# Patient Record
Sex: Female | Born: 1980 | Race: White | Hispanic: No | Marital: Married | Smoking: Never smoker
Health system: Southern US, Community
[De-identification: ages and names within clinical notes are randomized; demographics above are authoritative.]

## PROBLEM LIST (undated history)

## (undated) DIAGNOSIS — N390 Urinary tract infection, site not specified: Secondary | ICD-10-CM

## (undated) DIAGNOSIS — O139 Gestational [pregnancy-induced] hypertension without significant proteinuria, unspecified trimester: Secondary | ICD-10-CM

## (undated) HISTORY — PX: DILATION AND CURETTAGE OF UTERUS: SHX78

---

## 2008-11-03 ENCOUNTER — Ambulatory Visit (HOSPITAL_COMMUNITY): Admission: RE | Admit: 2008-11-03 | Discharge: 2008-11-03 | Payer: Self-pay | Admitting: Obstetrics

## 2009-10-25 ENCOUNTER — Inpatient Hospital Stay (HOSPITAL_COMMUNITY): Admission: AD | Admit: 2009-10-25 | Discharge: 2009-10-26 | Payer: Self-pay | Admitting: Obstetrics

## 2009-10-25 DIAGNOSIS — R51 Headache: Secondary | ICD-10-CM

## 2009-10-25 DIAGNOSIS — O9989 Other specified diseases and conditions complicating pregnancy, childbirth and the puerperium: Secondary | ICD-10-CM

## 2010-01-11 ENCOUNTER — Inpatient Hospital Stay (HOSPITAL_COMMUNITY): Admission: AD | Admit: 2010-01-11 | Discharge: 2009-11-07 | Payer: Self-pay | Admitting: Obstetrics and Gynecology

## 2010-04-19 LAB — URINALYSIS, ROUTINE W REFLEX MICROSCOPIC
Bilirubin Urine: NEGATIVE
Glucose, UA: NEGATIVE mg/dL
Hgb urine dipstick: NEGATIVE
Ketones, ur: NEGATIVE mg/dL
Nitrite: NEGATIVE
Protein, ur: NEGATIVE mg/dL
Specific Gravity, Urine: 1.025 (ref 1.005–1.030)
Urobilinogen, UA: 0.2 mg/dL (ref 0.0–1.0)
pH: 5.5 (ref 5.0–8.0)

## 2010-04-19 LAB — CBC
Hemoglobin: 11.5 g/dL — ABNORMAL LOW (ref 12.0–15.0)
MCH: 34.1 pg — ABNORMAL HIGH (ref 26.0–34.0)
MCV: 98.5 fL (ref 78.0–100.0)
Platelets: 262 10*3/uL (ref 150–400)
RBC: 3.37 MIL/uL — ABNORMAL LOW (ref 3.87–5.11)
RDW: 13.3 % (ref 11.5–15.5)
WBC: 13.3 10*3/uL — ABNORMAL HIGH (ref 4.0–10.5)
WBC: 14 10*3/uL — ABNORMAL HIGH (ref 4.0–10.5)

## 2010-04-19 LAB — URINE MICROSCOPIC-ADD ON: RBC / HPF: NONE SEEN RBC/hpf (ref <3)

## 2010-04-19 LAB — COMPREHENSIVE METABOLIC PANEL
ALT: 10 U/L (ref 0–35)
Albumin: 2.8 g/dL — ABNORMAL LOW (ref 3.5–5.2)
Alkaline Phosphatase: 188 U/L — ABNORMAL HIGH (ref 39–117)
GFR calc Af Amer: >60 mL/min (ref >60)
Potassium: 4.1 mEq/L (ref 3.5–5.1)
Sodium: 136 mEq/L (ref 135–145)
Total Protein: 6 g/dL (ref 6.0–8.3)

## 2010-04-19 LAB — LACTATE DEHYDROGENASE: LDH: 121 U/L (ref 94–250)

## 2010-04-19 LAB — RPR: RPR Ser Ql: NONREACTIVE

## 2011-09-27 ENCOUNTER — Encounter (HOSPITAL_COMMUNITY): Payer: Self-pay | Admitting: *Deleted

## 2011-09-27 ENCOUNTER — Inpatient Hospital Stay (HOSPITAL_COMMUNITY)
Admission: AD | Admit: 2011-09-27 | Discharge: 2011-10-29 | DRG: 650 | Disposition: A | Discharge status: Hospice | Payer: BC Managed Care – PPO | Source: Ambulatory Visit | Attending: Obstetrics and Gynecology | Admitting: Obstetrics and Gynecology

## 2011-09-27 DIAGNOSIS — O4410 Placenta previa with hemorrhage, unspecified trimester: Principal | ICD-10-CM | POA: Diagnosis present

## 2011-09-27 DIAGNOSIS — D649 Anemia, unspecified: Secondary | ICD-10-CM | POA: Diagnosis present

## 2011-09-27 DIAGNOSIS — O4400 Placenta previa specified as without hemorrhage, unspecified trimester: Secondary | ICD-10-CM | POA: Diagnosis present

## 2011-09-27 DIAGNOSIS — O9902 Anemia complicating childbirth: Secondary | ICD-10-CM | POA: Diagnosis present

## 2011-09-27 HISTORY — DX: Gestational (pregnancy-induced) hypertension without significant proteinuria, unspecified trimester: O13.9

## 2011-09-27 HISTORY — DX: Urinary tract infection, site not specified: N39.0

## 2011-09-27 LAB — CBC
HCT: 32.8 % — ABNORMAL LOW (ref 36.0–46.0)
MCHC: 33.8 g/dL (ref 30.0–36.0)
Platelets: 216 10*3/uL (ref 150–400)
RDW: 13.1 % (ref 11.5–15.5)
WBC: 13 10*3/uL — ABNORMAL HIGH (ref 4.0–10.5)

## 2011-09-27 LAB — OB RESULTS CONSOLE ABO/RH: RH Type: POSITIVE

## 2011-09-27 LAB — OB RESULTS CONSOLE ANTIBODY SCREEN: Antibody Screen: NEGATIVE

## 2011-09-27 LAB — OB RESULTS CONSOLE RUBELLA ANTIBODY, IGM: Rubella: IMMUNE

## 2011-09-27 LAB — OB RESULTS CONSOLE HEPATITIS B SURFACE ANTIGEN: Hepatitis B Surface Ag: NEGATIVE

## 2011-09-27 MED ORDER — ACETAMINOPHEN 325 MG PO TABS
650.0000 mg | ORAL_TABLET | ORAL | Status: DC | PRN
  Administered 2011-10-03: 650 mg via ORAL
  Filled 2011-09-27: qty 2

## 2011-09-27 MED ORDER — NIFEDIPINE 10 MG PO CAPS
20.0000 mg | ORAL_CAPSULE | Freq: Once | ORAL | Status: AC
  Administered 2011-09-27: 20 mg via ORAL

## 2011-09-27 MED ORDER — INDOMETHACIN 25 MG PO CAPS
25.0000 mg | ORAL_CAPSULE | Freq: Four times a day (QID) | ORAL | Status: AC
  Administered 2011-09-28 – 2011-09-29 (×8): 25 mg via ORAL
  Filled 2011-09-27 (×10): qty 1

## 2011-09-27 MED ORDER — CALCIUM CARBONATE ANTACID 500 MG PO CHEW
2.0000 | CHEWABLE_TABLET | ORAL | Status: DC | PRN
  Administered 2011-10-04 – 2011-10-09 (×2): 400 mg via ORAL
  Administered 2011-10-18: 600 mg via ORAL
  Administered 2011-10-18: 400 mg via ORAL
  Filled 2011-09-27 (×4): qty 2

## 2011-09-27 MED ORDER — ZOLPIDEM TARTRATE 5 MG PO TABS: 5.0000 mg | ORAL_TABLET | Freq: Every evening | ORAL | Status: DC | PRN

## 2011-09-27 MED ORDER — MAGNESIUM SULFATE 40 G IN LACTATED RINGERS - SIMPLE
1.0000 g/h | INTRAVENOUS | Status: DC
  Filled 2011-09-27: qty 500

## 2011-09-27 MED ORDER — MAGNESIUM SULFATE BOLUS VIA INFUSION
4.0000 g | Freq: Once | INTRAVENOUS | Status: AC
  Administered 2011-09-27: 4 g via INTRAVENOUS
  Filled 2011-09-27: qty 500

## 2011-09-27 MED ORDER — LACTATED RINGERS IV SOLN
INTRAVENOUS | Status: DC
  Administered 2011-09-27: 18:00:00 via INTRAVENOUS
  Administered 2011-09-28: 75 mL/h via INTRAVENOUS

## 2011-09-27 MED ORDER — NIFEDIPINE 10 MG PO CAPS
ORAL_CAPSULE | ORAL | Status: AC
  Administered 2011-09-27: 20 mg via ORAL
  Filled 2011-09-27: qty 2

## 2011-09-27 MED ORDER — PRENATAL MULTIVITAMIN CH
1.0000 | ORAL_TABLET | Freq: Every day | ORAL | Status: DC
  Administered 2011-09-28 – 2011-10-24 (×27): 1 via ORAL
  Filled 2011-09-27 (×29): qty 1

## 2011-09-27 MED ORDER — DOCUSATE SODIUM 100 MG PO CAPS
100.0000 mg | ORAL_CAPSULE | Freq: Every day | ORAL | Status: DC
  Administered 2011-09-28 – 2011-10-08 (×11): 100 mg via ORAL
  Filled 2011-09-27 (×11): qty 1

## 2011-09-27 MED ORDER — INDOMETHACIN 50 MG PO CAPS
50.0000 mg | ORAL_CAPSULE | Freq: Once | ORAL | Status: AC
  Administered 2011-09-27: 50 mg via ORAL
  Filled 2011-09-27: qty 1

## 2011-09-27 NOTE — MAU Provider Note (Signed)
Attestation of Attending Supervision of Advanced Practitioner (CNM/NP): Evaluation and management procedures were performed by the Advanced Practitioner under my supervision and collaboration.  I have reviewed the Advanced Practitioner's note and chart, and I agree with the management and plan.  HARRAWAY-SMITH, Jvion Turgeon 9:23 PM     

## 2011-09-27 NOTE — MAU Note (Signed)
Known previa, hx of bleed in July- received betamethasone.  Noted bleeding today around 4.  Denies pain. abd soft

## 2011-09-27 NOTE — MAU Provider Note (Signed)
Pt presents at [redacted]w[redacted]d with known previa. Reports vaginal bleeding since 4pm today. Denies leaking of fluid, contractions, abd pain. States good fetal movement.   Filed Vitals:   09/27/11 1723  BP: 111/70  Pulse: 97  Temp: 98.6 F (37 C)  Resp: 18   General : WF, NAD, comfortable appearing Abd: gravid, non tender, soft Perineum: blood stained, small amount of BRB noted on new pad Ext: full ROM, no edema, no evidence DVT FHR: 125, mod variability, + 15x15 accels, no decels Cat I tracing Toco: No contractions  Assessment:  Bleeding previa at [redacted]w[redacted]d Fetal testing c/w well-being  Plan:  Will admit to ante with routine orders. Dr. Billy Coast to see pt after finishing in OR Pt s/p BMZ in July  Neylan Koroma E. 6:17 PM

## 2011-09-27 NOTE — Progress Notes (Signed)
Patient ID: Denise Hunt, female   DOB: Jan 29, 1981, 31 y.o.   MRN: [id number] S: Acute episode of heavy bleeding today, now with spotting. NO LOF. Good FM. Denies Contractions. No CP or SOB.  O: BP 93/58  Pulse 120  Temp 98.5 F (36.9 C) (Oral)  Resp 18  Ht 5\' 2"  (1.575 m)  Wt 60.238 kg (132 lb 12.8 oz)  BMI 24.29 kg/m2  Breastfeeding? Unknown  NCAT HEENT: nl Neck: supple with FROM Lgs: CTA CV: RRR Abd: Gravid Nt No CVAT Ext: neg c/c/e Pelvic : deferred Neuro: nonfocal Skin: intact  NST Reactive. Category 1 tracing Rare contractions  CBC    Component Value Date/Time   WBC 13.0* 09/27/2011 1807   RBC 3.42* 09/27/2011 1807   HGB 11.1* 09/27/2011 1807   HCT 32.8* 09/27/2011 1807   PLT 216 09/27/2011 1807   MCV 95.9 09/27/2011 1807   MCH 32.5 09/27/2011 1807   MCHC 33.8 09/27/2011 1807   RDW 13.1 09/27/2011 1807    A: 28 5/7 wk IUP Complete Previa with second episode of bleeding in last 30d. BMZ complete. Abnormal GTT  P: Proceed with MgSO4 neuroprophylaxis EFM and Toco Bedrest with BRP SCDs Type and screen.

## 2011-09-28 NOTE — H&P (Signed)
Denise Hunt, Denise Hunt            ACCOUNT NO.:  [id number]  MEDICAL RECORD NO.:  [id number]  LOCATION:  9159                          FACILITY:  [facility]  PHYSICIAN:  Lenoard Aden, M.D.DATE OF BIRTH:  02-20-1980  DATE OF ADMISSION:  09/27/2011 DATE OF DISCHARGE:                             HISTORY & PHYSICAL   CHIEF COMPLAINT:  Bleeding.  HISTORY OF PRESENT ILLNESS:  She is a 31 year old white female, G3, P1 at 59 and 5/7th weeks gestation who presents with acute onset of bleeding x1 this evening which is now tapered off to spotting.  She denies contractions.  She denies leakage of fluids.  She reports good fetal movement.  Her prenatal course has been complicated by complete placenta previa and abnormal 1-hour GTT.  ALLERGIES:  She has no known drug allergies.  MEDICATIONS:  Prenatal vitamins.  SOCIAL HISTORY:  She is a nonsmoker, nondrinker.  She denies domestic or physical violence.  She has medications to include Tums as well.  FAMILY HISTORY:  She has a family history of diabetes, hypertension, colon cancer, thyroid dysfunction.  Previous history of a primary vaginal delivery in 2011 and an SAB at 2009.  PHYSICAL EXAMINATION:  GENERAL:  She is a well-developed, well- nourished, white female, in no acute distress. VITAL SIGNS:  Stable.  The patient is afebrile. HEENT:  Normal. NECK:  Supple.  Full range of motion. LUNGS:  Clear. HEART:  Regular rhythm. ABDOMEN:  Soft, nontender. PELVIC:  Deferred. EXTREMITIES:  There are no cords. NEUROLOGIC:  Nonfocal. SKIN:  Intact.  Pad is consistent with just a small amount of spotting. NST is reactive.  No contractions were noted.  IMPRESSION: 1. A 28 and 5/7th week intrauterine pregnancy. 2. Complete placenta previa with second episode of bleeding within the     last 30 days.  PLAN:  Proceed with admission and magnesium for neuro prophylaxis, type and cross.  Continuous fetal monitoring.  Bedrest with  bathroom privileges.     Lenoard Aden, M.D.     RJT/MEDQ  D:  09/27/2011  T:  09/28/2011  Job:  [id number]

## 2011-09-28 NOTE — Progress Notes (Signed)
Patient ID: Denise Hunt, female   DOB: 1980/02/12, 31 y.o.   MRN: [id number] HD #1 28 6/7 weeks Complete Previa  S: Acute episode of heavy bleeding today, now with spotting that has resolved over night.  NO LOF. Good FM.  Denies Contractions.  No CP or SOB.   O:  BP 102/61  Pulse 83  Temp 98 F (36.7 C) (Oral)  Resp 18  Ht 5\' 2"  (1.575 m)  Wt 60.238 kg (132 lb 12.8 oz)  BMI 24.29 kg/m2  Breastfeeding? Unknown  NCAT  HEENT: nl  Neck: supple with FROM  Lgs: CTA  CV: RRR  Abd: Gravid Nt  No CVAT  Ext: neg c/c/e , SCDs intact Pelvic : deferred  No blood on pad Neuro: nonfocal  Skin: intact   NST Reactive.  Category 1 tracing  Rare contractions   CBC    Component  Value  Date/Time    WBC  13.0*  09/27/2011 1807    RBC  3.42*  09/27/2011 1807    HGB  11.1*  09/27/2011 1807    HCT  32.8*  09/27/2011 1807    PLT  216  09/27/2011 1807    MCV  95.9  09/27/2011 1807    MCH  32.5  09/27/2011 1807    MCHC  33.8  09/27/2011 1807    RDW  13.1  09/27/2011 1807    A:  28 5/7 wk IUP  Complete Previa with second episode of bleeding in last 30d. BMZ complete.  Sono with AGA , nl AFI and nl CL on 8/22 Abnormal GTT   P:  Continue with MgSO4 neuroprophylaxis  Continue Indocin x 48 hrs EFM and Toco continuous today Bedrest with BRP  SCDs  Type and screen current  Will need 3hr GTT

## 2011-09-29 DIAGNOSIS — O4400 Placenta previa specified as without hemorrhage, unspecified trimester: Secondary | ICD-10-CM | POA: Diagnosis present

## 2011-09-29 NOTE — Progress Notes (Signed)
Patient ID: Denise Hunt, female   DOB: [birth year], 31 y.o.   MRN: [id number] HD#[id number]  29 0/7 weeks Complete Previa   S:  Acute episode of heavy bleeding on admission now with spotting especially when using restroom. NO LOF. Good FM.  Denies Contractions.  No CP or SOB.   O:  BP 102/69  Pulse 89  Temp 98 F (36.7 C) (Oral)  Resp 18  Ht 5\' 2"  (1.575 m)  Wt 60.238 kg (132 lb 12.8 oz)  BMI 24.29 kg/m2  Breastfeeding? Unknown  NCAT  HEENT: nl  Neck: supple with FROM  Lgs: CTA  CV: RRR  Abd: Gravid Nt  No CVAT  Ext: neg c/c/e , SCDs intact  Pelvic : deferred  No blood on pad  Neuro: nonfocal  Skin: intact   NST Reactive.  Category 1 tracing  Rare contractions   CBC    Component  Value  Date/Time    WBC  13.0*  09/27/2011 1807    RBC  3.42*  09/27/2011 1807    HGB  11.1*  09/27/2011 1807    HCT  32.8*  09/27/2011 1807    PLT  216  09/27/2011 1807    MCV  95.9  09/27/2011 1807    MCH  32.5  09/27/2011 1807    MCHC  33.8  09/27/2011 1807    RDW  13.1  09/27/2011 1807   A:  29 0/7 wk IUP  Complete Previa with second episode of bleeding in last 30d. BMZ complete. Continued spotting. Sono with AGA , nl AFI and nl CL on 8/22  Abnormal GTT   P:   MgSO4 neuroprophylaxis completed Continue Indocin x 48 hrs  EFM and Toco for one hr q shift Bedrest with BRP  SCDs  Type and screen current  Will need NICU consult (pt considering, declines at this time) 3 GTT to be ordered Not a candidate for outpatient care at this time.

## 2011-09-30 MED ORDER — SODIUM CHLORIDE 0.9 % IJ SOLN
3.0000 mL | Freq: Two times a day (BID) | INTRAMUSCULAR | Status: DC
  Administered 2011-09-30 – 2011-10-05 (×9): 3 mL via INTRAVENOUS

## 2011-09-30 NOTE — Progress Notes (Signed)
Ur chart review completed.  

## 2011-09-30 NOTE — Progress Notes (Signed)
Patient ID: Denise Hunt, female   DOB: May 28, 1980, 31 y.o.   MRN: [id number] HD#[id number] 29 1/7 weeks Complete Previa with bleeding  S:  Acute episode of heavy bleeding on admission now with no bleeding since Saturday am 8/24. NO LOF. Good FM.  Denies Contractions or abdominal pain No CP or SOB.   O:  BP 104/65  Pulse 92  Temp 98.7 F (37.1 C) (Oral)  Resp 18  Ht 5\' 2"  (1.575 m)  Wt 60.238 kg (132 lb 12.8 oz)  BMI 24.29 kg/m2  Breastfeeding? Unknown  General: no distress  Abd: Gravid Nt , no fundal tenderness  Ext: neg c/c/e , SCDs intact  Pelvic : deferred  No blood on pad    NST Reactive.  Category 1 tracing  Rare contractions   CBC    Component  Value  Date/Time    WBC  13.0*  09/27/2011 1807    RBC  3.42*  09/27/2011 1807    HGB  11.1*  09/27/2011 1807    HCT  32.8*  09/27/2011 1807    PLT  216  09/27/2011 1807    MCV  95.9  09/27/2011 1807    MCH  32.5  09/27/2011 1807    MCHC  33.8  09/27/2011 1807    RDW  13.1  09/27/2011 1807   A:  29 1/7 wk IUP  Complete Previa with second episode of bleeding in last 30d. BMZ complete. Continued spotting. Sono with AGA , nl AFI and nl CL on 8/22  Abnormal GTT   P:   MgSO4 neuroprophylaxis completed S/p  Indocin x 48 hrs  EFM and Toco for one hr q shift Bedrest with BRP, will allow daily wheelchair privledges SCDs  Type and screen current, IV access Will need NICU consult (pt considering, declines at this time) 3 GTT tomorrow Not a candidate for outpatient care at this time, esp given long drive D/w pt rescue dose BMZ, defer at this time, consider around 31 wks unless clinical situation changes.  Chelly Dombeck A. 09/30/2011 11:18 AM

## 2011-10-01 LAB — TYPE AND SCREEN
ABO/RH(D): A POS
Antibody Screen: NEGATIVE
Unit division: 0
Unit division: 0

## 2011-10-01 LAB — GLUCOSE, 1 HOUR GESTATIONAL: Glucose Tolerance, 1 hour: 173 mg/dL (ref 70–189)

## 2011-10-01 LAB — GLUCOSE, 2 HOUR GESTATIONAL: Glucose Tolerance, 2 hour: 139 mg/dL (ref 70–164)

## 2011-10-01 LAB — GLUCOSE, 3 HOUR GESTATIONAL: Glucose, GTT - 3 Hour: 159 mg/dL — ABNORMAL HIGH (ref 70–144)

## 2011-10-01 MED ORDER — GUAIFENESIN 100 MG/5ML PO SYRP
200.0000 mg | ORAL_SOLUTION | ORAL | Status: DC | PRN
  Administered 2011-10-01 – 2011-10-04 (×5): 200 mg via ORAL
  Filled 2011-10-01 (×6): qty 118

## 2011-10-01 MED ORDER — SALINE SPRAY 0.65 % NA SOLN
1.0000 | NASAL | Status: DC | PRN
  Administered 2011-10-01: 1 via NASAL
  Filled 2011-10-01: qty 44

## 2011-10-01 NOTE — Progress Notes (Signed)
Patient ID: Denise Hunt, female   DOB: Dec 19, 1980, 31 y.o.   MRN: [id number] HD#[id number] 29 2/7 weeks Complete Previa with bleeding, last bleeding Sat 8/24  S:  Acute episode of heavy bleeding on admission now with no bleeding since Saturday am 8/24. NO LOF. Good FM.  Denies Contractions or abdominal pain, despite ctx noted on monitor earlier today. Pt notes stuffy nose. No CP or SOB.   O:  BP 106/65  Pulse 105  Temp 98.2 F (36.8 C) (Oral)  Resp 20  Ht 5\' 2"  (1.575 m)  Wt 60.238 kg (132 lb 12.8 oz)  BMI 24.29 kg/m2  Breastfeeding? Unknown  General: no distress  Abd: Gravid Nt , no fundal tenderness  Back: no cvat Ext: neg c/c/e , SCDs not on, will replace Pelvic : deferred  No blood on pad    NST Reactive.  Category 1 tracing: 130's, + accels, no decels, 10 beat variability Toco: ctx q 10-120 min, not felt by pt, resolved w/ hydration and void  CBC    Component  Value  Date/Time    WBC  13.0*  09/27/2011 1807    RBC  3.42*  09/27/2011 1807    HGB  11.1*  09/27/2011 1807    HCT  32.8*  09/27/2011 1807    PLT  216  09/27/2011 1807    MCV  95.9  09/27/2011 1807    MCH  32.5  09/27/2011 1807    MCHC  33.8  09/27/2011 1807    RDW  13.1  09/27/2011 1807   A:  29 2/7 wk IUP  Complete Previa with second episode of bleeding in last 30d. BMZ complete. No longer spotting. Sono with AGA , nl AFI and nl CL on 8/22, repeat in 2 wks Failed 1 hr GTT, 3 hr borderline but not diagnostic for GDM   P:   MgSO4 neuroprophylaxis completed S/p  Indocin x 48 hrs  EFM and Toco for one hr q shift Bedrest with BRP, will allow daily wheelchair privledges SCDs  Type and screen current, IV access Will need NICU consult (pt considering, declines at this time)  Not a candidate for outpatient care at this time, esp given long drive D/w pt rescue dose BMZ, defer at this time, consider around 31 wks unless clinical situation changes. Ctx on monitor today, will re-eval if this continues or these are  felt by pt. Would r/o UTI, consider tocolysis, rescue BMZ  Advik Weatherspoon A. 10/01/2011 12:19 PM

## 2011-10-01 NOTE — Progress Notes (Signed)
MD in OR - asked to evaluate patient / nurse reported ctx this am  S: Feeling well - some sinus congestion and cough from drainage / hx sinusitis every fall      Not feeling ctx - mostly FM - occasional braxton-hicks daily      No bleeding or discharge this am      Active FM   O:  VS: Blood pressure 106/65, pulse 105, temperature 98.2 F (36.8 C), temperature source Oral, resp. rate 20, height 5\' 2"  (1.575 m), weight 60.238 kg (132 lb 12.8 oz), unknown if currently breastfeeding.        FHR : baseline 140 / variability moderate / accels -none / decels -none        Toco: irregular contractions  5-10 minutes / lasting 20-40 sec / mild        Cervix : defer exam - previa      A: 29 weeks complete previa      Braxton-hicks ctx       FHR category 1      Mild URI - likely viral versus rhinitis  P: Increase PO hydration with sinus congestion      Monitor for regular ctx or symptomatic ctx      Saline spray for nasal congestion / robitussin for cough - congestion  Marlinda Mike CNM, MSN 10/01/2011, 9:13 AM

## 2011-10-01 NOTE — Progress Notes (Signed)
Maggie in the OR taking the phone call to tell MD that pt contracting 5-7 min and not really feeling them.

## 2011-10-02 LAB — TYPE AND SCREEN: Antibody Screen: NEGATIVE

## 2011-10-02 NOTE — Progress Notes (Signed)
Patient ID: Denise Hunt, female   DOB: 12/24/1980, 31 y.o.   MRN: [id number] HD#[id number] 29 3/7 weeks Complete Previa with bleeding, last bleeding Sat 8/24  S:  Acute episode of heavy bleeding on admission now with no bleeding since Saturday am 8/24. NO LOF. Good FM. Pt notes some brown spotting last pm in discharge. No red bleeding, no pain. Pt did have ctx noted on monitor  yest am, resolved w/ hydration and void. These ctx were not felt by pt. No further ctx.  Denies Contractions or abdominal pain.  Pt notes stuffy nose and cough, slightly worse than yesterday. Using saline nose spray and robitussin prn.  No CP or SOB.   O:  BP 110/72  Pulse 96  Temp 98.4 F (36.9 C) (Oral)  Resp 20  Ht 5\' 2"  (1.575 m)  Wt 58.695 kg (129 lb 6.4 oz)  BMI 23.67 kg/m2  Breastfeeding? Unknown  General: no distress  Abd: Gravid Nt , no fundal tenderness  Back: no cvat Ext: neg c/c/e , SCDs in place Pelvic : deferred  NST Reactive.  Category 1 tracing: 130's, + accels, no decels, 10 beat variability Toco: no ctx, no irritibility  CBC    Component  Value  Date/Time    WBC  13.0*  09/27/2011 1807    RBC  3.42*  09/27/2011 1807    HGB  11.1*  09/27/2011 1807    HCT  32.8*  09/27/2011 1807    PLT  216  09/27/2011 1807    MCV  95.9  09/27/2011 1807    MCH  32.5  09/27/2011 1807    MCHC  33.8  09/27/2011 1807    RDW  13.1  09/27/2011 1807   A:  29 3/7 wk IUP  Complete Previa with second episode of bleeding in last 30d. BMZ complete. No longer bleeding. Sono with AGA , nl AFI and nl CL on 8/22, repeat in 2 wks  P:   MgSO4 neuroprophylaxis completed S/p  Indocin x 48 hrs  EFM and Toco for one hr q shift Bedrest with BRP, will allow daily wheelchair privledges SCDs  Type and screen current, IV access Will need NICU consult (pt considering, declines at this time)  Not a candidate for outpatient care at this time, esp given long drive D/w pt rescue dose BMZ, defer at this time, consider around 31 wks  unless clinical situation changes. FWB. Reasurring status, will repeat growth 2 wks from last, due 9/5.   Holmes Hays A. 10/02/2011 9:10 PM  Note: late entry of note, pt seen 1 pm.

## 2011-10-03 NOTE — Progress Notes (Signed)
UR Chart review completed.  

## 2011-10-03 NOTE — Progress Notes (Signed)
Patient ID: Denise Hunt, female   DOB: 08-Apr-1980, 31 y.o.   MRN: [id number] HD#[id number] 29 4/7 weeks Complete Previa with bleeding, last bleeding Sat 8/24  S:  Acute episode of heavy bleeding on admission now with no bleeding since Saturday am 8/24. NO LOF. Good FM. No red bleeding, no pain. No ctx.  Denies Contractions or abdominal pain.  Pt notes stuffy nose and cough, pt states goes away when she sits outside. Using saline nose spray and robitussin prn.  No CP or SOB.   O:  BP 103/69  Pulse 123  Temp 98.5 F (36.9 C) (Oral)  Resp 20  Ht 5\' 2"  (1.575 m)  Wt 58.695 kg (129 lb 6.4 oz)  BMI 23.67 kg/m2  Breastfeeding? Unknown  General: no distress  Abd: Gravid Nt , no fundal tenderness  Back: no cvat Ext: neg c/c/e , SCDs in place Pelvic : deferred  NST Reactive.  Category 1 tracing: 150's, + accels, no decels, 10 beat variability (9p tracing, elevated FH from prior) Toco: rare ctx  CBC    Component  Value  Date/Time    WBC  13.0*  09/27/2011 1807    RBC  3.42*  09/27/2011 1807    HGB  11.1*  09/27/2011 1807    HCT  32.8*  09/27/2011 1807    PLT  216  09/27/2011 1807    MCV  95.9  09/27/2011 1807    MCH  32.5  09/27/2011 1807    MCHC  33.8  09/27/2011 1807    RDW  13.1  09/27/2011 1807   A:  29 4/7 wk IUP  Complete Previa with second episode of bleeding in last 30d. BMZ complete. No longer bleeding. Sono with AGA , nl AFI and nl CL on 8/22, repeat in 2 wks  P:   MgSO4 neuroprophylaxis completed S/p  Indocin x 48 hrs  EFM and Toco for one hr q shift Bedrest with BRP, will allow daily wheelchair privledges SCDs  Type and screen current, IV access has not continued due to pt's discomfort with IV- will need to resume IV with any bleeding, cramping, change in status Will need NICU consult (pt considering, declines at this time)  Not a candidate for outpatient care at this time, esp given long drive D/w pt rescue dose BMZ, defer at this time, consider around 31 wks unless  clinical situation changes. FWB. Reasurring status, will repeat growth 2 wks from last, due 9/5.   Consider humidifier in room  [facility] A. 10/03/2011 9:40 PM  Note: late entry of note, pt seen 530 pm.

## 2011-10-04 LAB — CBC
HCT: 37.4 % (ref 36.0–46.0)
Hemoglobin: 12.7 g/dL (ref 12.0–15.0)
MCH: 32.9 pg (ref 26.0–34.0)
MCHC: 34 g/dL (ref 30.0–36.0)
RDW: 13.5 % (ref 11.5–15.5)

## 2011-10-04 LAB — TYPE AND SCREEN
ABO/RH(D): A POS
Antibody Screen: NEGATIVE

## 2011-10-04 MED ORDER — SODIUM CHLORIDE 0.9 % IJ SOLN
3.0000 mL | Freq: Two times a day (BID) | INTRAMUSCULAR | Status: DC
  Administered 2011-10-05 – 2011-10-25 (×35): 3 mL via INTRAVENOUS

## 2011-10-04 MED ORDER — CITRIC ACID-SODIUM CITRATE 334-500 MG/5ML PO SOLN
ORAL | Status: AC
  Administered 2011-10-04
  Filled 2011-10-04: qty 15

## 2011-10-04 MED ORDER — GUAIFENESIN 100 MG/5ML PO SYRP
200.0000 mg | ORAL_SOLUTION | Freq: Four times a day (QID) | ORAL | Status: DC
  Administered 2011-10-04 – 2011-10-06 (×4): 200 mg via ORAL
  Filled 2011-10-04 (×13): qty 118

## 2011-10-04 MED ORDER — FLUTICASONE PROPIONATE 50 MCG/ACT NA SUSP
2.0000 | Freq: Every day | NASAL | Status: DC
  Administered 2011-10-04 – 2011-10-14 (×10): 2 via NASAL
  Filled 2011-10-04: qty 16

## 2011-10-04 MED ORDER — SODIUM CHLORIDE 0.9 % IJ SOLN: 3.0000 mL | INTRAMUSCULAR | Status: DC | PRN

## 2011-10-04 MED ORDER — CITRIC ACID-SODIUM CITRATE 334-500 MG/5ML PO SOLN
30.0000 mL | Freq: Once | ORAL | Status: AC
  Administered 2011-10-04: 30 mL via ORAL

## 2011-10-04 MED ORDER — LACTATED RINGERS IV BOLUS (SEPSIS)
500.0000 mL | Freq: Once | INTRAVENOUS | Status: AC
  Administered 2011-10-04: 500 mL via INTRAVENOUS

## 2011-10-04 NOTE — Progress Notes (Signed)
rn called to the room, quarter sized bright red blood noted in underwear when up to shower, monitors applied, will call md

## 2011-10-04 NOTE — Progress Notes (Signed)
Patient ID: NEELA ZECCA, female   DOB: 07/18/1980, 31 y.o.   MRN: [id number] HD#[id number] 29 5/7 weeks Complete Previa with bleeding, last bleeding Sat 8/24  S:  Acute episode of heavy bleeding on admission now with no bleeding since Saturday am 8/24. NO LOF. Good FM. No red bleeding, no pain. No ctx.  Denies Contractions or abdominal pain.  Pt notes stuffy nose and cough, cough starting to get productive, pt states goes away when she sits outside. Using saline nose spray and robitussin prn. Now starting to have nasal congestion as well, no fevers No CP or SOB.   O:  BP 117/76  Pulse 104  Temp 98.9 F (37.2 C) (Oral)  Resp 18  Ht 5\' 2"  (1.575 m)  Wt 58.695 kg (129 lb 6.4 oz)  BMI 23.67 kg/m2  Breastfeeding? Unknown  General: no distress, "sniffing"  Abd: Gravid Nt , no fundal tenderness  Back: no cvat Ext: neg c/c/e , SCDs in place Pelvic : deferred  NST pending this am  CBC    Component  Value  Date/Time    WBC  13.0*  09/27/2011 1807    RBC  3.42*  09/27/2011 1807    HGB  11.1*  09/27/2011 1807    HCT  32.8*  09/27/2011 1807    PLT  216  09/27/2011 1807    MCV  95.9  09/27/2011 1807    MCH  32.5  09/27/2011 1807    MCHC  33.8  09/27/2011 1807    RDW  13.1  09/27/2011 1807   A:  29 5/7 wk IUP  Complete Previa with second episode of bleeding in last 30d. BMZ complete. No longer bleeding. Sono with AGA , nl AFI and nl CL on 8/22, repeat in 2 wks  P:   MgSO4 neuroprophylaxis completed S/p  Indocin x 48 hrs  EFM and Toco for one hr q shift Bedrest with BRP, will allow daily wheelchair privledges SCDs  Type and screen current, IV access has not continued due to pt's discomfort with IV- will need to resume IV with any bleeding, cramping, change in status Will need NICU consult (pt considering, declines at this time)  Not a candidate for outpatient care at this time, esp given long drive D/w pt rescue dose BMZ, defer at this time, consider around 31 wks unless clinical situation  changes. FWB. Reasurring status, will repeat growth 2 wks from last, due 9/5.   Plan humidifier in room, increase robitussin, trial of Flonase  Shamona Wirtz A. 10/04/2011 8:15 AM

## 2011-10-04 NOTE — Plan of Care (Signed)
Problem: Consults Goal: Birthing Suites Patient Information Press F2 to bring up selections list   Pt < [redacted] weeks EGA     

## 2011-10-05 NOTE — Progress Notes (Addendum)
Patient ID: NISHIKA PARKHURST, female   DOB: September 14, 1980, 31 y.o.   MRN: [id number] HD#[id number] 29 6/7 weeks Complete Previa with bleeding, last bleeding on 8/30 pm, very light spot of red blood.   S:  Feels well, no further bleeding since small episode last night. No LOF. Good FM. No pain. No ctx.  Stuffy nose/ cough, cough etc improved, is awaiting setting up humidifier in her room. No fever/chills.  No CP or SOB.   O:  VS stable, reviewed, A&O x 3, pleasant.  Abd: Gravid, relaxed, non tender Ext: neg c/c/e , SCDs in place Pelvic : deferred NST - q shift- reactive. No contractions.    A:  29 6/7 wk IUP -inpatient care due to bleeding previa, but stable at present.  Complete Previa with second episode of bleeding in last 30d. Last night was just light spotting. BMZ complete.  Sono with AGA , nl AFI and nl CL on 8/22, repeat in 2 wks  P:  MgSO4 neuroprophylaxis completed S/p  Indocin x 48 hrs  EFM and Toco for one hr q shift Bedrest with BRP, will allow daily wheelchair privledges SCDs  Type and screen current Will need NICU consult (pt considering, declines at this time)  Not a candidate for outpatient care at this time, esp given long drive D/w pt rescue dose BMZ, defer at this time, consider around 31 wks unless clinical situation changes. FWB. Reasurring status, will repeat growth 2 wks from last, due 9/5.    Sundae Maners R 10/05/2011 7:00 PM

## 2011-10-06 NOTE — Progress Notes (Signed)
Patient ID: Denise Hunt, female   DOB: Oct 16, 1980, 31 y.o.   MRN: [id number] HD# [id number] 30.0 weeks Complete Previa with bleeding, last bleeding on 9/1 morning (today), one spot, dark blood.   S:  Feels well, no further active bleeding since small spot today, seems like old blood. No LOF. Good FM. No pain. No ctx.  Stuffy nose/ cough, cough etc improved, is awaiting setting up humidifier in her room. No fever/chills.  No CP or SOB. Cough more productive now. Mostly white but few times yellow. Some eye swelling (she gets that with sinusitis), denies HA/ earache/ eye pressure. Has some post-nasal drip  O:  VS stable, reviewed, A&O x 3, pleasant.  Lungs CTA bilateral. No cervical LAD or tenderness.  CV RRR Abd: Gravid, relaxed, non tender Ext: neg c/c/e , SCDs in place Pelvic : deferred NST - q shift- reactive. No contractions.    A:  30.0 wk IUP -inpatient care due to bleeding previa, but stable at present.  Complete Previa with second episode of bleeding in last 30d. One spot of old blood today, overall stable BMZ complete.  Sono with AGA , nl AFI and nl CL on 8/22, repeat in 2 wks  P:  Cough, watch for sinusitis, pt to inform if mucus more or yellow/green, sinus pressure -will need Amox in that case, agrees.  Placenta Previa/ bleeding/when to proceed with surgery vs planned c-section at 36-37 wks/ excessive blood loss, possible hysterectomy/ transfusion etc reviewed, patient appropriately anxious, understands and agrees.   MgSO4 neuroprophylaxis completed S/p  Indocin x 48 hrs  EFM and Toco for one hr q shift Bedrest with BRP, will allow daily wheelchair privledges SCDs  Type and screen current Will need NICU consult (pt considering, declines at this time)   Not a candidate for outpatient care at this time, esp given long drive FWB. Reasurring status, will repeat growth 2 wks from last, due 9/5.    Omar Orrego R 10/06/2011 3:38 PM

## 2011-10-07 LAB — TYPE AND SCREEN: Antibody Screen: NEGATIVE

## 2011-10-07 LAB — CBC
HCT: 32.7 % — ABNORMAL LOW (ref 36.0–46.0)
Hemoglobin: 11 g/dL — ABNORMAL LOW (ref 12.0–15.0)
MCHC: 33.6 g/dL (ref 30.0–36.0)
RBC: 3.41 MIL/uL — ABNORMAL LOW (ref 3.87–5.11)

## 2011-10-07 MED ORDER — LACTATED RINGERS IV SOLN
INTRAVENOUS | Status: DC
  Administered 2011-10-07: 23:00:00 via INTRAVENOUS

## 2011-10-07 MED ORDER — NIFEDIPINE 10 MG PO CAPS
10.0000 mg | ORAL_CAPSULE | Freq: Three times a day (TID) | ORAL | Status: DC
  Administered 2011-10-07: 10 mg via ORAL
  Filled 2011-10-07: qty 1

## 2011-10-07 MED ORDER — GUAIFENESIN 100 MG/5ML PO SYRP: 200.0000 mg | ORAL_SOLUTION | Freq: Four times a day (QID) | ORAL | Status: DC | PRN

## 2011-10-07 MED ORDER — GUAIFENESIN 100 MG/5ML PO SOLN
10.0000 mL | Freq: Four times a day (QID) | ORAL | Status: DC | PRN
  Administered 2011-10-08: 200 mg via ORAL

## 2011-10-07 NOTE — Progress Notes (Signed)
Patient ID: Denise Hunt, female   DOB: 07-Dec-1980, 32 y.o.   MRN: [id number] Came in to evaluate patient's bleeding.  Noted bleeding with clot and blood in commode at 10.45 pm when got up to use bathroom. EBL abt 100-150cc) Denies contractions and leaking fluid. Feels good FMs. Patient in bed, left lateral, stable.   VS stable Uterus soft, non tender and no palpable contractions Peri-pad is now dry with no trickle or active bleeding  FHT 145/ + accels/ no decels/ moderate variability- category I Toco- some irritability noted.   CBC stat, T&S active (confirmed) LR at 150 cc rate Procardia 10mg  now (low BPs) and repeat q 8hrs until UCs resolve Is S/p BTMZ and s/p Magnesium for fetal neuroprophylaxis  Plan to continue close monitoring. Consent for c-section and blood transfusion.  Risks/complications of surgery reviewed incl infection, bleeding, damage to internal organs including bladder, bowels, ureters, blood vessels, other risks from anesthesia, VTE and delayed complications of any surgery, complications in future surgery reviewed. Discussed possible need for hysterectomy and repeat surgery. Need for blood transfusion and transfusion of blood products.  Also discussed neonatal complications from prematurity. Pt understands and agrees and consents. All questions/ concerns addressed.

## 2011-10-07 NOTE — Progress Notes (Signed)
Ur chart review completed.  

## 2011-10-07 NOTE — Progress Notes (Signed)
Hospital day # 10 pregnancy at [redacted]w[redacted]d  Complete previa/3rd trimester bleeding                                                                    S: well, reports good fetal activity      Contractions:none with increased PO hydration      Vaginal bleeding:none now, last brown spot 10/06/2011       Vaginal discharge: no significant change  O: BP 99/62  Pulse 85  Temp 98.1 F (36.7 C) (Oral)  Resp 18  Ht 5\' 2"  (1.575 m)  Wt 58.695 kg (129 lb 6.4 oz)  BMI 23.67 kg/m2  Breastfeeding? Unknown  FHR monitoring this am pending. 10/06/2011:  Fetal tracings:Fetal heart variability: moderate Fetal Heart Rate decelerations: none Fetal Heart Rate accelerations: yes Baseline FHR: 120-130 per minute reviewed and reassuring      Uterus non-tender      Extremities: no significant edema and no signs of DVT  A: [redacted]w[redacted]d with Placenta complete previa.  Stable currently, no bleeding, F-Well-being reassuring.       P: continue current plan of care.  Repeat US at 2 wks from last one.  FHR Monitoring 3x per day  Denise Hunt,MARIE-LYNE  MD 10/07/2011 10:11 AM

## 2011-10-08 LAB — CBC
HCT: 31.8 % — ABNORMAL LOW (ref 36.0–46.0)
MCV: 95.5 fL (ref 78.0–100.0)
Platelets: 216 10*3/uL (ref 150–400)
RBC: 3.33 MIL/uL — ABNORMAL LOW (ref 3.87–5.11)
RDW: 13.1 % (ref 11.5–15.5)
WBC: 12.9 10*3/uL — ABNORMAL HIGH (ref 4.0–10.5)

## 2011-10-08 MED ORDER — NIFEDIPINE 10 MG PO CAPS
10.0000 mg | ORAL_CAPSULE | Freq: Once | ORAL | Status: AC
  Administered 2011-10-08: 10 mg via ORAL

## 2011-10-08 MED ORDER — FERROUS SULFATE 325 (65 FE) MG PO TABS
325.0000 mg | ORAL_TABLET | Freq: Every day | ORAL | Status: DC
  Administered 2011-10-09 – 2011-10-24 (×16): 325 mg via ORAL
  Filled 2011-10-08 (×18): qty 1

## 2011-10-08 MED ORDER — LACTATED RINGERS IV SOLN
INTRAVENOUS | Status: DC
  Administered 2011-10-08 – 2011-10-12 (×8): via INTRAVENOUS

## 2011-10-08 MED ORDER — NIFEDIPINE 10 MG PO CAPS
10.0000 mg | ORAL_CAPSULE | Freq: Four times a day (QID) | ORAL | Status: DC
  Administered 2011-10-08 – 2011-10-24 (×66): 10 mg via ORAL
  Filled 2011-10-08 (×71): qty 1

## 2011-10-08 MED ORDER — BETAMETHASONE SOD PHOS & ACET 6 (3-3) MG/ML IJ SUSP
12.0000 mg | INTRAMUSCULAR | Status: AC
  Administered 2011-10-08 – 2011-10-09 (×2): 12 mg via INTRAMUSCULAR
  Filled 2011-10-08 (×2): qty 2

## 2011-10-08 MED ORDER — DOCUSATE SODIUM 100 MG PO CAPS
100.0000 mg | ORAL_CAPSULE | Freq: Two times a day (BID) | ORAL | Status: DC
  Administered 2011-10-08 – 2011-10-24 (×33): 100 mg via ORAL
  Filled 2011-10-08 (×34): qty 1

## 2011-10-08 NOTE — Progress Notes (Signed)
Met with pt to discuss progress thus far and recent events.   No bleeding this afternoon, no HA, feels nausea, not eaten much today, no dizziness  Pt tearful Toco: no ctx FH: reactive  A/P: placenta previa w/ recent bleeding, mom and baby stable - plan rescue dose BMZ as bleeding has increased over the past 3 days, pt and baby still stable but given BMZ done at 24 wks and now 30+ wks will repeat now. We discussed that it is best to give BMZ when we think delivery will happen in the next 1 wk but with bleeding previa we rarely have time to delay delivery in order to await action/ benefit of BMZ.  - anemia. Appropriate given recent bleed. Will start iron/ colace.  - cont nifedipine as long as bp tolerates  Gjon Letarte A. 10/08/2011 4:50 PM

## 2011-10-08 NOTE — Consult Note (Signed)
Asked by Dr Juliene Pina to speak to Mr Lobello to discuss outcome of premature babies. Mrs Kell declined to participate in this consult. Mr Ryles has toured the NICU this afternoon.  I reviewed Mrs Penton's chart. In summary, she is at 30 2/[redacted] weeks gestation. Prenatal labs are neg, GBS unknown. Pregnancy is complicated by elevated 1 hr GTT and complete placenta previa with bleeding - currently controlled. She has received betamethasone at 24 weeks, second course underway. S/P magnesium prophylaxis, and has also received indocin. Current meds: Procardia. FUS on 8/22 with EFW of 1382 gms, 88%, normal anatomy.  I spoke to Mr Igarashi in an outside patient room. I discussed resuscitation, and based on gestation, BW, and anatomy, good survival rate. I discussed most common morbidities associated, re: RDS with various resp support, treatment with surfactant, immaturity of all organ systems including GI, immune sys, CNS, vascular. Discussed IVH and ROP and ways to screen for these. Discussed temp support, likelihood of blood transfusion, HAL, gavage feeding, and LOS.  I discussed benefits of breastfeeding and he expressed that his wife plans on it.  Thanks you for this consult.  Fernanda Twaddell Q   Face to face 25 min

## 2011-10-08 NOTE — Progress Notes (Signed)
UR Chart review completed.  

## 2011-10-08 NOTE — Progress Notes (Signed)
Patient ID: Denise Hunt, female   DOB: 10/23/1980, 31 y.o.   MRN: [id number] HD# [id number] 30.2 weeks Complete Previa-- Bleeding restarted on 9/2 at 10.45 pm C/o fresh bleeding with clots, has uterine irritability and contractions. Per patient this is more bleeding than when got admitted. Has intermittently passed small clots when sits up to void but no active bleeding overnight and pad is mostly dry.   Denies HA since starting Procardia last night. Started with 10mg  at 11.45pm, repeated 10mg  at about 4 am. Plan to continue due to ongoing contractions and bleeding that has now slowed down.  Feels FMs. Cough improved, no antibx.   O:  VS stable, BP ranges in low normal- 80s-90s/ 50s-60s. Pulse stable 70s-90s. RR 18, Afebrile A&O x 3, pleasant. NAD, but appropriately anxious.  Lungs CTA bilateral.  CV RRR Abd: Gravid uterus, relaxed, non tender,  Ext: neg c/c/e , SCDs in place Pelvic: Speculum exam -old clots in vagina, cervix visually closed and  No active bleeding noted.   FHT- 140s/+accels/ no decels/ moderate variability- category I.  Toco- contractions q 3-4 minutes since last night, palpate very mild (patient not in discomfort from UCs).    Labs- CBC- Hgb 11 (dropped from 12.7 noted 3 days back). Will repeat this evening. Nl platelets.           T&S active.   A/P:  30.2 wk IUP -Complete Previa with recurrence of bleeding last night (3rd bleed), stable, bleeding has decreased. Mild anemia.  PT Contractions possible cause -continue Procardia 10mg  q 6 hrs (can't use higher dose due to low BPs).  Sono due 9/5. BMZ complete, Magnesium complete.  Patient declined NICU consult. I have discussed with NICU attending. Husband desires a tour and consult with NICU. Will arrange today.   Consent for C-section/ possible hysterectomy and blood transfusion in chart after extensive counseling with her and her husband last night.   Not a candidate for outpatient care at this time.   Jordynn Perrier  R 10/08/2011 11:37 AM

## 2011-10-08 NOTE — Progress Notes (Addendum)
Dr Ernestina Penna at the bedside x 

## 2011-10-08 NOTE — Progress Notes (Signed)
Dad robert up to nicu for a tour with RN.

## 2011-10-09 ENCOUNTER — Inpatient Hospital Stay (HOSPITAL_COMMUNITY): Payer: BC Managed Care – PPO

## 2011-10-09 MED ORDER — CITRIC ACID-SODIUM CITRATE 334-500 MG/5ML PO SOLN
30.0000 mL | Freq: Once | ORAL | Status: AC
  Administered 2011-10-09: 30 mL via ORAL

## 2011-10-09 MED ORDER — CITRIC ACID-SODIUM CITRATE 334-500 MG/5ML PO SOLN
ORAL | Status: AC
  Administered 2011-10-09: 30 mL via ORAL
  Filled 2011-10-09: qty 15

## 2011-10-09 MED ORDER — FAMOTIDINE 20 MG PO TABS
20.0000 mg | ORAL_TABLET | Freq: Two times a day (BID) | ORAL | Status: DC
  Administered 2011-10-09 – 2011-10-18 (×20): 20 mg via ORAL
  Filled 2011-10-09 (×20): qty 1

## 2011-10-09 NOTE — Progress Notes (Signed)
Dr Ernestina Penna called regarding ultrasound in [facility]. States ok for pt to go to [facility] for scan.

## 2011-10-09 NOTE — Progress Notes (Signed)
Patient ID: Denise Hunt, female   DOB: 04/28/1980, 31 y.o.   MRN: [id number] HD# [id number] 30.3 weeks Complete Previa-- Bleeding restarted on 9/2 at 10.45 pm.  Pt notes back pain, no contractions. Good FM, no LOF. Small spot of blood on pad this afternoon, no active bleeding currently. Pt w/ bothersome heartburn, just started on Pepcid. Pt does note nausea off and on and decreased appetite over the past few days.  U/s done this am, report pending. By images, still w/ previa.  Continues on Procardia and notes no HA, dizziness.    O:  Filed Vitals:   10/09/11 0700 10/09/11 0738 10/09/11 1134 10/09/11 1526  BP:  92/49 104/53 109/71  Pulse:  93 98 91  Temp:  98.2 F (36.8 C) 98.2 F (36.8 C) 98.4 F (36.9 C)  TempSrc:  Oral Oral Oral  Resp: 16 18 18 20   Height:      Weight:      SpO2:         A&O x 3, pleasant. NAD, but appropriately anxious. Tearful at long hospital stay, frustrated. Lungs CTA bilateral.  CV RRR Abd: Gravid uterus, relaxed, non tender,  Ext: neg c/c/e , SCDs in place Pelvic: No blood at perineum  FHT- 140s/+accels/ no decels/ moderate variability- category I.  Toco- no contractions   CBC    Component Value Date/Time   WBC 12.9* 10/08/2011 1401   RBC 3.33* 10/08/2011 1401   HGB 10.7* 10/08/2011 1401   HCT 31.8* 10/08/2011 1401   PLT 216 10/08/2011 1401   MCV 95.5 10/08/2011 1401   MCH 32.1 10/08/2011 1401   MCHC 33.6 10/08/2011 1401   RDW 13.1 10/08/2011 1401    Hgb down from 12.7  A/P:  30.3 wk IUP -Complete Previa with frequent small episodes bright red bleeding. Last significant bleed 10/07/11. Bleeding overall stable. Cont modified bed rest, pelvic rest, in house monitoring.   Mild anemia. Cont once daily iron. Will transfuse if needed  PT Contractions/ uterine irritability. Unclear whether bleeding causing irritability or irritability causing bleeding. Continue to treat with Procardia 10mg  q 6 hrs (can't use higher dose due to low BPs).   Sono done 9/4,  report pending  BMZ complete, finishing rescue dose.  Magnesium complete.   Patient declined NICU consult but husband as met with NICU.   Consent for C-section/ possible hysterectomy and blood transfusion in chart after extensive counseling.  Reactive fetal testing  Not a candidate for outpatient care at this time.   Lambert Jeanty A. 10/09/2011 4:59 PM

## 2011-10-09 NOTE — Progress Notes (Signed)
To [facility] for ultrasound 

## 2011-10-10 LAB — TYPE AND SCREEN: Antibody Screen: NEGATIVE

## 2011-10-10 NOTE — Progress Notes (Signed)
Patient ID: Denise Hunt, female   DOB: November 01, 1980, 31 y.o.   MRN: [id number] HD#[id number]  30 4/7 weeks Complete Previa   S:  Spotting yesterday. Had heavier bleeding on 9/2 and 9/3 NO LOF. Good FM.  Denies Contractions now but notes in middle of night. Notes contractions just prior to next Procardia dose. No CP or SOB  O: BP 104/61  Pulse 101  Temp 98.5 F (36.9 C) (Oral)  Resp 18  Ht 5\' 2"  (1.575 m)  Wt 58.695 kg (129 lb 6.4 oz)  BMI 23.67 kg/m2  SpO2 99%  Head:NCAT  HEENT: nl  Neck: supple with FROM  Lgs: CTA  CV: RRR  Abd: Gravid Nt  No CVAT  Ext: neg c/c/e , SCDs off Pelvic : deferred  No blood on pad  Neuro: nonfocal  Skin: intact   NST Reactive.  BTBV 5-25 No decels noted. 15x15 accels. Category 1 tracing  Rare contractions    CBC    Component Value Date/Time   WBC 12.9* 10/08/2011 1401   RBC 3.33* 10/08/2011 1401   HGB 10.7* 10/08/2011 1401   HCT 31.8* 10/08/2011 1401   PLT 216 10/08/2011 1401   MCV 95.5 10/08/2011 1401   MCH 32.1 10/08/2011 1401   MCHC 33.6 10/08/2011 1401   RDW 13.1 10/08/2011 1401     A:  30 4/7 wk IUP  Complete Previa with third episode of bleeding in last 30d. BMZ complete(rescue doses x 2 given). Continued spotting. Sono with AGA , nl AFI on 9/4 Intermittent contractions on Procardia- will adjust dosing as needed.   P:   MgSO4 neuroprophylaxis completed Continue Procardia EFM and Toco continuous at this time Bedrest with BRP  SCDs  Type and screen current  NICU consult done with husband only Not a candidate for outpatient care at this time. Recommend in-house management until delivery Saline lock

## 2011-10-10 NOTE — Progress Notes (Signed)
Pt called out and reports feeling like her heart is working really hard. Vitals wnl, pt position adjusted, fhr reassuring on the monitor. Pt reasssured.

## 2011-10-11 LAB — URINE MICROSCOPIC-ADD ON

## 2011-10-11 LAB — URINALYSIS, ROUTINE W REFLEX MICROSCOPIC
Glucose, UA: NEGATIVE mg/dL
pH: 7.5 (ref 5.0–8.0)

## 2011-10-11 NOTE — Progress Notes (Signed)
Pt notes mild increase in pelvic pressure, no VB, continues w/ Procardia.   PE:  Filed Vitals:   10/11/11 0017 10/11/11 0433 10/11/11 0934 10/11/11 1155  BP: 94/52 100/59 102/67 107/64  Pulse: 98 74 87 95  Temp: 98.3 F (36.8 C)  99 F (37.2 C) 98.7 F (37.1 C)  TempSrc: Oral  Oral Oral  Resp: 18 18 20 20   Height:      Weight:      SpO2:       Gen: well appearing, mood improved from earlier this week Abd: gravid, uterus soft, NT GU: def LE: SCDs in place Toco: occ ctx, some irritibiity FH: 140's, reactive  A/P: Uterine irritability - fluid bolus, continuous monitoring, check UCx - dispo, inpt for now - if irritability ceases, no further pressure, OK for shower and to come off continuous monitoring in am  Khush Pasion A. 10/11/2011 1:45 PM

## 2011-10-11 NOTE — Progress Notes (Signed)
MD walking onto the unit after being called.  md updated on the complaints of the pt.  MD to room and talking to pt, continue with the IVF bolus and cont monitoring.

## 2011-10-11 NOTE — Progress Notes (Signed)
S: had some brown vaginal d/c. Oc ctx when she does not take procardia (+) FM S/p Magnesium IV, steroid conmplete  O: VSS Afebrile Lungs clear to A Cor RRR Abdomen: gravid soft nontender Extremity> no edema or calf tenderness  Tracing: reactive baseline 140  No ctx noted  IMP: Previa( posterior S/p vaginal bleeding now stable' IUP @ 30 5/7 weeks  P) cont inpt mgmt

## 2011-10-11 NOTE — Progress Notes (Signed)
30 5/[redacted] weeks gestation, with complete previa.  Height  62" Weight 129 Lbs pre-pregnancy weight 118Lbs.Pre-pregnancy  BMI 21.6  IBW 110 Lbs  Total weight gain 11 Lbs. Weight gain goals 25-35 Lbs.   Estimated needs: 16-1800 kcal/day, 55-65  grams protein/day, 1.9 liters fluid/day  Antenatal Regular diet tolerated well Current diet prescription will provide for increased needs. No abnormal nutrition related labs.  Hemoglobin & Hematocrit     Component Value Date/Time   HGB 10.7* 10/08/2011 1401   HCT 31.8* 10/08/2011 1401     Nutrition Dx: Increased nutrient needs r/t pregnancy and fetal growth requirements aeb [redacted] weeks gestation.  No educational needs assessed at this time.

## 2011-10-11 NOTE — Progress Notes (Signed)
Pt instructed to void and then lay back on her side.  Toco readjusted to accommodate for position change.

## 2011-10-11 NOTE — Progress Notes (Signed)
UR Chart review completed.  

## 2011-10-12 LAB — URINE CULTURE: Colony Count: 35000

## 2011-10-12 MED ORDER — LACTATED RINGERS IV BOLUS (SEPSIS)
1000.0000 mL | Freq: Once | INTRAVENOUS | Status: AC
  Administered 2011-10-12: 1000 mL via INTRAVENOUS

## 2011-10-12 NOTE — Progress Notes (Signed)
1650- pt contracting 4-5 min. Up to bathroom.

## 2011-10-12 NOTE — Progress Notes (Signed)
Hospital day # 15 pregnancy at [redacted]w[redacted]d with Placenta previa  S: well, reports good fetal activity      Contractions:none, occasional irritability      Vaginal bleeding:none now       Vaginal discharge: no significant change  O: BP 96/56  Pulse 83  Temp 98.6 F (37 C) (Oral)  Resp 20  Ht 5\' 2"  (1.575 m)  Wt 58.695 kg (129 lb 6.4 oz)  BMI 23.67 kg/m2  SpO2 99%      Fetal tracings:Fetal heart variability: moderate.  Good accelerations.  No deceleration.  B.Line 140's. reviewed and reassuring      Uterus non-tender      Extremities: no significant edema and no signs of DVT  A: [redacted]w[redacted]d with Placenta previa.  No active bleeding.  Fetal Well-being reassuring.     stable  P: continue current plan of care.  May shower.  Sena Hoopingarner,MARIE-LYNE  MD 10/12/2011 9:13 AM

## 2011-10-12 NOTE — Progress Notes (Signed)
Dr. Seymour Bars notified that over the past 1 hr pt has continued to contract after having her to lie on her side and empty her bladder. Orders received for one liter bolus of lr and to give 4pm dose of procardia now.

## 2011-10-12 NOTE — Progress Notes (Signed)
Dr. Seymour Bars notified of pt's uc's. States to continue to observe for now.

## 2011-10-12 NOTE — Progress Notes (Signed)
Spoke with Dr. Rodman Pickle regarding patient request to have 20g IV started vs 18g IV due to multiple IV sites, sticks, blood draws, arms being very tender. Explained to Dr. Rodman Pickle pt is a complete previa with a recent bleed on Monday however with no active bleeding at this time. Patient very emotional and tearful. Support and encouragement given.

## 2011-10-13 LAB — TYPE AND SCREEN
ABO/RH(D): A POS
Antibody Screen: NEGATIVE

## 2011-10-13 MED ORDER — POLYETHYLENE GLYCOL 3350 17 G PO PACK
17.0000 g | PACK | Freq: Every day | ORAL | Status: DC | PRN
  Administered 2011-10-20: 17 g via ORAL
  Filled 2011-10-13 (×2): qty 1

## 2011-10-13 NOTE — Progress Notes (Signed)
Hospital day # 16 pregnancy at [redacted]w[redacted]d  Placenta Previa    S: well, reports good fetal activity      Contractions:  Mild and irregular this am.      Vaginal bleeding:none now       Vaginal discharge: no significant change  O: BP 96/58  Pulse 78  Temp 98.3 F (36.8 C) (Oral)  Resp 20  Ht 5\' 2"  (1.575 m)  Wt 58.695 kg (129 lb 6.4 oz)  BMI 23.67 kg/m2  SpO2 99%      Fetal tracings:Fetal heart variability: moderate Fetal Heart Rate decelerations: none reviewed and reassuring, reactive with a BLine of 140.      Uterus non-tender      Extremities: no significant edema and no signs of DVT  A: [redacted]w[redacted]d with Placenta previa.  No recent vaginal bleeding.  Fetal well-being reassuring.     stable  P: continue current plan of care.  Toco continuously.  FHR x 1 hr TID.  Abie Cheek,MARIE-LYNE  MD 10/13/2011 11:55 AM

## 2011-10-14 NOTE — Progress Notes (Signed)
Pt sitting up in rocking chair and feeling good. Will allow a little more time off the monitor. Will call for RN when ready to get back to bed.

## 2011-10-14 NOTE — Progress Notes (Signed)
Pt contracting every 3-4.61mins, up to void and change positions

## 2011-10-14 NOTE — Progress Notes (Signed)
Hospital day # 17 pregnancy at [redacted]w[redacted]d  Placenta Previa. Last bleed 10/07/11   S: well, reports good fetal activity      Contractions:  Regular, mild over the last hour.      Vaginal bleeding:none now       Vaginal discharge: no significant change  O: BP 101/53  Pulse 85  Temp 98.7 F (37.1 C) (Oral)  Resp 18  Ht 5\' 2"  (1.575 m)  Wt 58.695 kg (129 lb 6.4 oz)  BMI 23.67 kg/m2  SpO2 99%      Fetal tracings:Fetal heart variability: moderate Fetal Heart Rate decelerations: none reviewed and reassuring, reactive with a BLine of 140. Toco: q 4 min      Uterus non-tender      Extremities: no significant edema and no signs of DVT, SCDs in place Cvx: exam deferred  A: [redacted]w[redacted]d with Placenta previa.  No recent vaginal bleeding.  Fetal well-being reassuring.     stable  P: continue current plan of care.  Will manage ctx at this time expectantly, procardia just given, if no response plan fluid bolus Reactive fetal testing, growth u/s due 9/18  [facility] A.  MD 10/14/2011 4:29 PM

## 2011-10-15 NOTE — Progress Notes (Signed)
UR Chart review completed.  

## 2011-10-15 NOTE — Progress Notes (Signed)
Hospital day # 18 pregnancy at [redacted]w[redacted]d  Placenta Previa. Last bleed 10/07/11   S: well, reports good fetal activity      Some contractions yesterday prior to procardia, resolved about 30 min after procardia dosed      Vaginal bleeding:none now       Vaginal discharge: no significant change  O: BP 100/54  Pulse 82  Temp 98.1 F (36.7 C) (Oral)  Resp 20  Ht 5\' 2"  (1.575 m)  Wt 58.695 kg (129 lb 6.4 oz)  BMI 23.67 kg/m2  SpO2 99%      Fetal tracings:Fetal heart variability: moderate Fetal Heart Rate decelerations: none reviewed and reassuring, reactive with a BLine of 140. Toco:none, occasional irritibility      Uterus non-tender      Extremities: no significant edema and no signs of DVT, SCDs off currently though has been using Cvx: exam deferred  A: [redacted]w[redacted]d with Placenta previa.  No recent vaginal bleeding.  Fetal well-being reassuring.     stable  P: continue current plan of care.  wil add back wheelchair privilege  Reactive fetal testing, growth u/s due 9/18  Hayden Mabin A.  MD 10/15/2011 11:48 AM

## 2011-10-16 MED ORDER — INFLUENZA VIRUS VACC SPLIT PF IM SUSP
0.5000 mL | INTRAMUSCULAR | Status: AC
  Administered 2011-10-17: 0.5 mL via INTRAMUSCULAR
  Filled 2011-10-16: qty 0.5

## 2011-10-16 MED ORDER — LORATADINE 10 MG PO TABS
10.0000 mg | ORAL_TABLET | Freq: Every day | ORAL | Status: DC
  Administered 2011-10-16 – 2011-10-24 (×9): 10 mg via ORAL
  Filled 2011-10-16 (×12): qty 1

## 2011-10-16 NOTE — Progress Notes (Signed)
Hospital day # 19 pregnancy at [redacted]w[redacted]d  Placenta Previa. Last bleed 10/07/11   S: well, reports good fetal activity      Mild contractions daily prior to procardia dose      Vaginal bleeding:none now       Vaginal discharge: no significant change       Pt notes continued cough, non-productive, improved from prior- pt concerned about cough post-c/s causing pain. No fevers, no SOB.  O: BP 114/66  Pulse 89  Temp 98.5 F (36.9 C) (Oral)  Resp 20  Ht 5\' 2"  (1.575 m)  Wt 58.695 kg (129 lb 6.4 oz)  BMI 23.67 kg/m2  SpO2 99%      Fetal tracings:Fetal heart variability: moderate Fetal Heart Rate decelerations: none reviewed and reassuring, reactive with a BLine of 140. Toco:none, occasional irritibility      Uterus non-tender      Extremities: no significant edema and no signs of DVT, SCDs off currently though has been using Cvx: exam deferred Pulm: CTAB Abd: gravid, NT No CVAT  A: [redacted]w[redacted]d with Placenta previa.  No recent vaginal bleeding.  Fetal well-being reassuring.     stable  P: continue current plan of care.  wil add back wheelchair privilege  Reactive fetal testing, growth u/s due 9/18 Cough, improves w/ humidifier, no evidence lung infection, no difference in cough w/ cessation of Flonase- will stop Flonase, will add back Claritin to see if this improves post-nasal drip and thus cough.  Lendon Colonel  MD 10/16/2011 4:29 PM

## 2011-10-17 MED ORDER — NIFEDIPINE 10 MG PO CAPS
10.0000 mg | ORAL_CAPSULE | Freq: Once | ORAL | Status: AC
  Administered 2011-10-17: 10 mg via ORAL

## 2011-10-17 NOTE — Progress Notes (Signed)
Patient ID: Denise Hunt, female   DOB: [birth year], 31 y.o.   MRN: [id number] S:Called for contractions every 3 min. Mild in quality. No bleeding. Good FM.  O: BP 105/65  Pulse 85  Temp 98.8 F (37.1 C) (Oral)  Resp 20  Ht 5\' 2"  (1.575 m)  Wt 58.695 kg (129 lb 6.4 oz)  BMI 23.67 kg/m2  SpO2 99%  Fhr reactive, no decels , contractions now irregular 4 UC in one hr.  A: Preterm contractions now controlled s/p additional Procardia dose Previa- no bleeding  P: Continue present care.

## 2011-10-17 NOTE — Progress Notes (Signed)
Hospital day # 20 pregnancy at [redacted]w[redacted]d  Placenta Previa. Last bleed 10/07/11   S: well, reports good fetal activity      Mild contractions daily prior to procardia dose, no ctx at present      Vaginal bleeding:none now       Vaginal discharge: no significant change      No HA,no dizziness       Pt notes improvement in cough after starting Claritin yesterday.,   O: BP 115/72  Pulse 87  Temp 98.8 F (37.1 C) (Oral)  Resp 20  Ht 5\' 2"  (1.575 m)  Wt 58.695 kg (129 lb 6.4 oz)  BMI 23.67 kg/m2  SpO2 99%      Fetal tracings:Fetal heart variability: moderate Fetal Heart Rate decelerations: none reviewed and reassuring, reactive with a BLine of 130. Toco:none, occasional irritibility      Uterus non-tender      Extremities: no significant edema and no signs of DVT, SCDs off currently though has been using Cvx: exam deferred Pulm: CTAB Abd: gravid, NT No CVAT  A: [redacted]w[redacted]d with Placenta previa.  No recent vaginal bleeding.  Fetal well-being reassuring.     stable  P: continue current plan of care.  wil add back wheelchair privilege, will stop continuous tocometry, plan prn  Reactive fetal testing, growth u/s due 9/18 Cough, improves w/ humidifier and now Claritin, no evidence lung infection, no difference in cough w/ cessation of Flonase- will stop Flonase.  Reedy Biernat A.  MD 10/17/2011 1:25 PM

## 2011-10-18 LAB — CBC
HCT: 34.7 % — ABNORMAL LOW (ref 36.0–46.0)
Hemoglobin: 11.4 g/dL — ABNORMAL LOW (ref 12.0–15.0)
MCH: 31.5 pg (ref 26.0–34.0)
MCHC: 32.9 g/dL (ref 30.0–36.0)
MCV: 95.9 fL (ref 78.0–100.0)
RBC: 3.62 MIL/uL — ABNORMAL LOW (ref 3.87–5.11)

## 2011-10-18 MED ORDER — CITRIC ACID-SODIUM CITRATE 334-500 MG/5ML PO SOLN
ORAL | Status: AC
  Filled 2011-10-18: qty 15

## 2011-10-18 MED ORDER — CITRIC ACID-SODIUM CITRATE 334-500 MG/5ML PO SOLN
30.0000 mL | Freq: Once | ORAL | Status: AC
  Administered 2011-10-18: 30 mL via ORAL

## 2011-10-18 NOTE — Progress Notes (Signed)
Patient ID: Denise Hunt, female   DOB: Aug 03, 1980, 31 y.o.   MRN: [id number] HD#[id number]  31 5/7 weeks Complete Previa   S:  No Spotting yesterday. Had heavier bleeding on 9/2 and 9/3 NO LOF. Good FM.  Denies Contractions now but notes in middle of night. Noted that some of the contractions were "stronger' yesterday. Had extra dose Procardia last night. No CP or SOB. No HA.  O: BP 110/76  Pulse 90  Temp 98.8 F (37.1 C) (Oral)  Resp 20  Ht 5\' 2"  (1.575 m)  Wt 58.695 kg (129 lb 6.4 oz)  BMI 23.67 kg/m2  SpO2 99%  Head:NCAT  HEENT: nl  Neck: supple with FROM  Lgs: CTA  CV: RRR  Abd: Gravid Nt  No CVAT  Ext: neg c/c/e , SCDs on Pelvic : deferred  No blood on pad  Neuro: nonfocal  Skin: intact   NST Reactive.  BTBV 5-25 No decels noted. 15x15 accels. Category 1 tracing  Rare contractions    CBC    Component Value Date/Time   WBC 12.9* 10/08/2011 1401   RBC 3.33* 10/08/2011 1401   HGB 10.7* 10/08/2011 1401   HCT 31.8* 10/08/2011 1401   PLT 216 10/08/2011 1401   MCV 95.5 10/08/2011 1401   MCH 32.1 10/08/2011 1401   MCHC 33.6 10/08/2011 1401   RDW 13.1 10/08/2011 1401     A:  31 5/7 wk IUP  Complete Previa with multiple bleeding episodes. BMZ complete(rescue doses x 2 given).  Sono with AGA , nl AFI on 9/4- fu sono next week Intermittent contractions on Procardia- will adjust dosing as needed.   P:   MgSO4 neuroprophylaxis completed Continue Procardia EFM and Toco intermittent at this time Bedrest with BRP  SCDs  Type and screen current  Not a candidate for outpatient care at this time. Recommend in-house management until delivery Saline lock

## 2011-10-18 NOTE — Progress Notes (Signed)
RN called to room to see pt c/o discoloration (purple) in her upper thigh area bilaterally.  Pt noticing it after her shower.  Pt denies any pain at the site or any tingling.  Peripheral pulses good as well as peripheral color.  We will keep an eye on it at this time.

## 2011-10-18 NOTE — Progress Notes (Signed)
MD notified that pt has had this discoloration in her thighs and that it has not went away.  It has improved but is still noted with pt now c/o a small area on her upper ant thigh feeling like a "bruise".  Discussed with MD, orders received for CBC.

## 2011-10-18 NOTE — Progress Notes (Signed)
UR Chart review completed.  

## 2011-10-19 LAB — TYPE AND SCREEN: ABO/RH(D): A POS

## 2011-10-19 MED ORDER — PANTOPRAZOLE SODIUM 40 MG PO TBEC
40.0000 mg | DELAYED_RELEASE_TABLET | Freq: Every day | ORAL | Status: DC
  Administered 2011-10-19 – 2011-10-25 (×7): 40 mg via ORAL
  Filled 2011-10-19 (×9): qty 1

## 2011-10-19 NOTE — Progress Notes (Signed)
MD notified of pt c/o sharp pain in her lower left abdoman, No VB noted at this time.  Pt on the EFM and baby with accels and no decels noted at this time.  Per MD keep her on the monitor for a little while to observe.

## 2011-10-19 NOTE — Progress Notes (Signed)
RN called to room with pt c/o pain in her left lower abdoman and describing the pain as sharp and constant.  Pt denies any VB.  Pt rates her pain at a 3-4 out of a 0-10 scale.

## 2011-10-19 NOTE — Progress Notes (Signed)
S: no complaint. Notes ctxs in the evening. Thinks they are getting stronger. Denies vaginal spotting or bleeding (+) FM  O: VS BP 103/66. T98. P67  Lungs clear to A Cor RRR ABd: gravid nontender Pad none Extr(-)edema or calf tenderness  Tracing: baseline 130 (+) accels to 150 occ ctx  IMP: placenta previa stable IUP @ 31 6/7 week. BMZ complete  P) cont present mgmt. sono next week

## 2011-10-20 ENCOUNTER — Encounter (HOSPITAL_COMMUNITY): Payer: Self-pay | Admitting: Registered Nurse

## 2011-10-20 NOTE — Progress Notes (Signed)
S: no complaint. LLQ pain noted yesterday but now resolved. (+) FM  O: VSS afebrile Lung: clear to A Cor: RRR  Abd: gravid nontender  Pad none Ext (-)edema or calf tenderness  Tracing: baseline 125 irreg ctx now resolved  IMP: Placenta Previa IUP @ 32 wk   P) cont present mgmt

## 2011-10-21 MED ORDER — SODIUM CHLORIDE 0.9 % IV SOLN
2.0000 g | Freq: Four times a day (QID) | INTRAVENOUS | Status: DC
  Filled 2011-10-21 (×2): qty 2000

## 2011-10-21 NOTE — Progress Notes (Signed)
Monitors removed.

## 2011-10-21 NOTE — Progress Notes (Deleted)
CTSP for decels, variable w/ ctx  S; Pt comfortable w/ epidural O:  Filed Vitals:   10/21/11 1610  BP: 91/56  Pulse: 73  Temp: 98.6 F (37 C)  Resp: 18  bp now 121/91 Abd: NT, AGA Cvx: 6/90%/ vtx 0, LOA, caput/ molding at +1, bloody mucous at introitus, clear amniotic fluid return Toco: q 3 min FH: 120's, 10 beat variability, + accels, rare decels prior to 4:40p, since then has been having deep variable to 60's, lasting up to 1 min, with most contractions. Good recovery and good variability.   GBS pos  CBC    Component Value Date/Time   WBC 13.3* 10/18/2011 1736   RBC 3.62* 10/18/2011 1736   HGB 11.4* 10/18/2011 1736   HCT 34.7* 10/18/2011 1736   PLT 225 10/18/2011 1736   MCV 95.9 10/18/2011 1736   MCH 31.5 10/18/2011 1736   MCHC 32.9 10/18/2011 1736   RDW 13.8 10/18/2011 1736    A/P: Labor, augmenting w/ pitocin - GBS pos, 2nd dose ampicillin now FWB. Needs close monitoring due to decels. Will hold pitocin for 30 minutes then re-eval. Start amnioinfusion. decels likely due to more rapid change in cvx/ station Labor. Spontaneous, then needing augmentation. Prior SVD x 3, most recent >10 yrs ago  Denise Hunt A. 10/21/2011 5:40 PM

## 2011-10-21 NOTE — Progress Notes (Signed)
Patient ID: Denise Hunt, female   DOB: July 09, 1980, 31 y.o.   MRN: [id number] HD#[id number]  32 1/7 weeks Complete Previa   S:  No Spotting. Had heavier bleeding on 9/2 and 9/3- none since NO LOF. Good FM.  Denies regular Contractions now. Continued irregular contractions. No CP or SOB. No HA.  O: BP 87/51  Pulse 76  Temp 98 F (36.7 C) (Oral)  Resp 18  Ht 5\' 2"  (1.575 m)  Wt 58.695 kg (129 lb 6.4 oz)  BMI 23.67 kg/m2  SpO2 99%  Head:NCAT  HEENT: nl  Neck: supple with FROM  Lgs: CTA  CV: RRR  Abd: Gravid NT  No CVAT  Ext: neg c/c/e , SCDs on Pelvic : deferred  No blood on pad  Neuro: nonfocal  Skin: intact   NST Reactive.  BTBV 5-25 No decels noted. 15x15 accels. Category 1 tracing  Reactive NST x 3 Rare contractions (1-4 per hour)   CBC    Component Value Date/Time   WBC 13.3* 10/18/2011 1736   RBC 3.62* 10/18/2011 1736   HGB 11.4* 10/18/2011 1736   HCT 34.7* 10/18/2011 1736   PLT 225 10/18/2011 1736   MCV 95.9 10/18/2011 1736   MCH 31.5 10/18/2011 1736   MCHC 32.9 10/18/2011 1736   RDW 13.8 10/18/2011 1736     A:  32 1/7 wk IUP  Complete Previa with multiple bleeding episodes. BMZ complete(rescue doses x 2 given).  Sono with AGA , nl AFI on 9/4- fu sono next week Intermittent contractions on Procardia- will adjust dosing as needed.   P:   MgSO4 neuroprophylaxis completed Continue Procardia EFM and Toco intermittent at this time Bedrest with BRP  SCDs  Type and screen current  Continue in-house management until delivery Saline lock

## 2011-10-21 NOTE — Progress Notes (Signed)
Ur chart review completed.  

## 2011-10-22 ENCOUNTER — Inpatient Hospital Stay (HOSPITAL_COMMUNITY): Payer: BC Managed Care – PPO

## 2011-10-22 LAB — TYPE AND SCREEN
ABO/RH(D): A POS
Antibody Screen: NEGATIVE

## 2011-10-22 NOTE — Progress Notes (Signed)
Hospital day # 25 pregnancy at [redacted]w[redacted]d  Placenta Previa. Last bleed 10/07/11   S: well, reports good fetal activity      Occasional contractions and uterine irritibility      Vaginal bleeding:none now       Vaginal discharge: no significant change      No HA,no dizziness. Pt does note bluish tint to b/l upper thighs after showering that resolved in about 1 hr, no pain       Pt notes improvement in cough since starting daily Claritin and adding humidifier.   O: BP 114/81  Pulse 84  Temp 98.4 F (36.9 C) (Oral)  Resp 16  Ht 5\' 2"  (1.575 m)  Wt 58.695 kg (129 lb 6.4 oz)  BMI 23.67 kg/m2  SpO2 99%      Fetal tracings:Fetal heart variability: moderate Fetal Heart Rate decelerations: none reviewed and reassuring, reactive with a BLine of 130. Toco:none, occasional irritibility      Uterus non-tender      Extremities: no significant edema and no signs of DVT, SCDs in place, no LE edema or tenderness Cvx: exam deferred Abd: gravid, NT No CVAT  A: [redacted]w[redacted]d with Placenta previa.  No recent vaginal bleeding.  Fetal well-being reassuring.     stable  P: continue current plan of care.  Reactive fetal testing, growth u/s due 9/18 Cough, improves w/ humidifier and Claritin, no evidence lung infection Cont inpt monitoring, likely delivery at 36 wks unless clinical situation necessitates sooner  [facility] A.  MD 10/22/2011 9:33 PM

## 2011-10-23 ENCOUNTER — Inpatient Hospital Stay (HOSPITAL_COMMUNITY): Payer: BC Managed Care – PPO

## 2011-10-23 NOTE — Progress Notes (Signed)
Denise Hunt  was seen today for an ultrasound appointment.  See full report in [facility].  Alpha Gula, MD  Single IUP at 32 3/7 weeks Interval fetal growth is appropriate (62nd %tile) Normal interval fetal anatmy Normal amniotic fluid volume TA views of cervix - partial to complete placenta previa  Continue serial growth ultrasounds q 3-4 weeks Tentatively plan scheduled C-section at 36 weeks or sooner as clinically dictated

## 2011-10-23 NOTE — Progress Notes (Signed)
Hospital day # 26 pregnancy at [redacted]w[redacted]d  Placenta Previa. Last bleed 10/07/11   S: well, reports good fetal activity      Occasional contractions and uterine irritibility      Vaginal bleeding:none now       Vaginal discharge: no significant change      No HA,no dizziness. Pt does note bluish tint to b/l upper thighs after showering that resolved in about 1 hr, no pain       Pt notes improvement in cough since starting daily Claritin and adding humidifier.   O: BP 97/54  Pulse 69  Temp 98.2 F (36.8 C) (Oral)  Resp 18  Ht 5\' 2"  (1.575 m)  Wt 60.873 kg (134 lb 3.2 oz)  BMI 24.55 kg/m2  SpO2 99%      Fetal tracings:Fetal heart variability: moderate Fetal Heart Rate decelerations: none reviewed and reassuring, reactive with a BLine of 130. Toco:none, occasional irritibility      Uterus non-tender      Extremities: no significant edema and no signs of DVT, SCDs in place, no LE edema or tenderness Cvx: exam deferred Abd: gravid, NT No CVAT  A: [redacted]w[redacted]d with Placenta previa.  No recent vaginal bleeding.  Fetal well-being reassuring.     stable  P: continue current plan of care.  Reactive fetal testing, growth u/s 9/18- 61% partial to complete previa persists, cont growth q 3-4wks  Cough, improves w/ humidifier and Claritin, no evidence lung infection Cont inpt monitoring, likely delivery at 36 wks unless clinical situation necessitates sooner  [facility] A.  MD 10/23/2011 1:25 PM

## 2011-10-24 MED ORDER — NIFEDIPINE 10 MG PO CAPS: 20.0000 mg | ORAL_CAPSULE | Freq: Four times a day (QID) | ORAL | Status: DC

## 2011-10-24 MED ORDER — NIFEDIPINE 10 MG PO CAPS
10.0000 mg | ORAL_CAPSULE | Freq: Once | ORAL | Status: AC
  Administered 2011-10-24: 10 mg via ORAL
  Filled 2011-10-24: qty 1

## 2011-10-24 MED ORDER — NIFEDIPINE 10 MG PO CAPS
10.0000 mg | ORAL_CAPSULE | Freq: Four times a day (QID) | ORAL | Status: DC
  Administered 2011-10-24 – 2011-10-25 (×4): 10 mg via ORAL
  Filled 2011-10-24 (×8): qty 1

## 2011-10-24 NOTE — Progress Notes (Signed)
Hospital day # 27 pregnancy at [redacted]w[redacted]d  Placenta previa.  Last vaginal bleed 10/07/2011  S: well, reports good fetal activity      Contractions:none      Vaginal bleeding:none now       Vaginal discharge: no significant change  O: BP 90/57  Pulse 70  Temp 98.5 F (36.9 C) (Oral)  Resp 18  Ht 5\' 2"  (1.575 m)  Wt 60.873 kg (134 lb 3.2 oz)  BMI 24.55 kg/m2  SpO2 99%      Fetal tracings:reviewed and reassuring.  Reactive 10/23/2011 at night.  No deceleration.  B. Line 130's.      Pending monitoring this am.      Uterus non-tender      Extremities: no significant edema and no signs of DVT  A: [redacted]w[redacted]d with Placenta previa (still present on last [place] last Wednesday.  No vaginal bleed x 10/07/2011.  F-Well-being reassuring.     stable  P: continue current plan of care  Winefred Hillesheim,MARIE-LYNE  MD 10/24/2011 9:14 AM

## 2011-10-24 NOTE — Progress Notes (Signed)
UR Chart review completed.  

## 2011-10-25 ENCOUNTER — Encounter (HOSPITAL_COMMUNITY): Payer: Self-pay | Admitting: Anesthesiology

## 2011-10-25 ENCOUNTER — Encounter (HOSPITAL_COMMUNITY): Payer: Self-pay | Admitting: *Deleted

## 2011-10-25 ENCOUNTER — Inpatient Hospital Stay (HOSPITAL_COMMUNITY): Payer: BC Managed Care – PPO | Admitting: Anesthesiology

## 2011-10-25 ENCOUNTER — Encounter (HOSPITAL_COMMUNITY)
Admission: AD | Disposition: A | Discharge status: Hospice | Payer: Self-pay | Source: Ambulatory Visit | Attending: Obstetrics and Gynecology

## 2011-10-25 LAB — CBC
Hemoglobin: 11.8 g/dL — ABNORMAL LOW (ref 12.0–15.0)
MCH: 32.2 pg (ref 26.0–34.0)
MCHC: 33.7 g/dL (ref 30.0–36.0)
Platelets: 185 10*3/uL (ref 150–400)
RBC: 3.85 MIL/uL — ABNORMAL LOW (ref 3.87–5.11)
RDW: 13.9 % (ref 11.5–15.5)
RDW: 13.8 % (ref 11.5–15.5)
WBC: 11.2 10*3/uL — ABNORMAL HIGH (ref 4.0–10.5)

## 2011-10-25 LAB — DIC (DISSEMINATED INTRAVASCULAR COAGULATION)PANEL
D-Dimer, Quant: 0.63 ug/mL-FEU — ABNORMAL HIGH (ref 0.00–0.48)
INR: 0.98 (ref 0.00–1.49)
Platelets: 187 10*3/uL (ref 150–400)
Prothrombin Time: 12.9 seconds (ref 11.6–15.2)

## 2011-10-25 LAB — PREPARE RBC (CROSSMATCH)

## 2011-10-25 SURGERY — Surgical Case
Anesthesia: Epidural

## 2011-10-25 MED ORDER — ONDANSETRON HCL 4 MG/2ML IJ SOLN
INTRAMUSCULAR | Status: DC | PRN
  Administered 2011-10-25: 4 mg via INTRAVENOUS

## 2011-10-25 MED ORDER — ONDANSETRON HCL 4 MG/2ML IJ SOLN
INTRAMUSCULAR | Status: AC
  Filled 2011-10-25: qty 2

## 2011-10-25 MED ORDER — LACTATED RINGERS IV SOLN
INTRAVENOUS | Status: DC
  Administered 2011-10-25 (×2): via INTRAVENOUS

## 2011-10-25 MED ORDER — EPHEDRINE 5 MG/ML INJ
INTRAVENOUS | Status: AC
  Filled 2011-10-25: qty 10

## 2011-10-25 MED ORDER — KETOROLAC TROMETHAMINE 30 MG/ML IJ SOLN
30.0000 mg | Freq: Four times a day (QID) | INTRAMUSCULAR | Status: AC | PRN
  Administered 2011-10-25: 30 mg via INTRAVENOUS

## 2011-10-25 MED ORDER — SCOPOLAMINE 1 MG/3DAYS TD PT72
MEDICATED_PATCH | TRANSDERMAL | Status: AC
  Filled 2011-10-25: qty 1

## 2011-10-25 MED ORDER — LACTATED RINGERS IV SOLN
INTRAVENOUS | Status: DC | PRN
  Administered 2011-10-25: 20:00:00 via INTRAVENOUS

## 2011-10-25 MED ORDER — BUPIVACAINE IN DEXTROSE 0.75-8.25 % IT SOLN
INTRATHECAL | Status: DC | PRN
  Administered 2011-10-25: 1.5 mL via INTRATHECAL

## 2011-10-25 MED ORDER — SCOPOLAMINE 1 MG/3DAYS TD PT72
1.0000 | MEDICATED_PATCH | Freq: Once | TRANSDERMAL | Status: AC
  Administered 2011-10-25: 1.5 mg via TRANSDERMAL

## 2011-10-25 MED ORDER — MIDAZOLAM HCL 2 MG/2ML IJ SOLN: 0.5000 mg | Freq: Once | INTRAMUSCULAR | Status: AC | PRN

## 2011-10-25 MED ORDER — CITRIC ACID-SODIUM CITRATE 334-500 MG/5ML PO SOLN
ORAL | Status: AC
  Administered 2011-10-25: 30 mL
  Filled 2011-10-25: qty 15

## 2011-10-25 MED ORDER — KETOROLAC TROMETHAMINE 30 MG/ML IJ SOLN: 30.0000 mg | Freq: Four times a day (QID) | INTRAMUSCULAR | Status: AC | PRN

## 2011-10-25 MED ORDER — FENTANYL CITRATE 0.05 MG/ML IJ SOLN
INTRAMUSCULAR | Status: DC | PRN
  Administered 2011-10-25: 75 ug via INTRAVENOUS
  Administered 2011-10-25: 25 ug via INTRATHECAL

## 2011-10-25 MED ORDER — MEPERIDINE HCL 25 MG/ML IJ SOLN: 6.2500 mg | INTRAMUSCULAR | Status: DC | PRN

## 2011-10-25 MED ORDER — PROMETHAZINE HCL 25 MG/ML IJ SOLN: 6.2500 mg | INTRAMUSCULAR | Status: DC | PRN

## 2011-10-25 MED ORDER — EPHEDRINE SULFATE 50 MG/ML IJ SOLN
INTRAMUSCULAR | Status: DC | PRN
  Administered 2011-10-25 (×3): 10 mg via INTRAVENOUS

## 2011-10-25 MED ORDER — KETOROLAC TROMETHAMINE 30 MG/ML IJ SOLN
INTRAMUSCULAR | Status: AC
  Filled 2011-10-25: qty 1

## 2011-10-25 MED ORDER — PHENYLEPHRINE 40 MCG/ML (10ML) SYRINGE FOR IV PUSH (FOR BLOOD PRESSURE SUPPORT)
PREFILLED_SYRINGE | INTRAVENOUS | Status: AC
  Filled 2011-10-25: qty 10

## 2011-10-25 MED ORDER — MORPHINE SULFATE (PF) 0.5 MG/ML IJ SOLN
INTRAMUSCULAR | Status: DC | PRN
  Administered 2011-10-25: .1 mg via INTRATHECAL

## 2011-10-25 MED ORDER — NIFEDIPINE 10 MG PO CAPS
10.0000 mg | ORAL_CAPSULE | Freq: Once | ORAL | Status: AC
  Administered 2011-10-25: 10 mg via ORAL

## 2011-10-25 MED ORDER — LACTATED RINGERS IV BOLUS (SEPSIS)
250.0000 mL | Freq: Once | INTRAVENOUS | Status: AC
  Administered 2011-10-25: 250 mL via INTRAVENOUS

## 2011-10-25 MED ORDER — LACTATED RINGERS IV SOLN
40.0000 [IU] | INTRAVENOUS | Status: DC | PRN
  Administered 2011-10-25: 40 [IU] via INTRAVENOUS

## 2011-10-25 MED ORDER — CEFAZOLIN SODIUM-DEXTROSE 2-3 GM-% IV SOLR
INTRAVENOUS | Status: AC
  Filled 2011-10-25: qty 50

## 2011-10-25 MED ORDER — PHENYLEPHRINE 40 MCG/ML (10ML) SYRINGE FOR IV PUSH (FOR BLOOD PRESSURE SUPPORT)
PREFILLED_SYRINGE | INTRAVENOUS | Status: AC
  Filled 2011-10-25: qty 5

## 2011-10-25 MED ORDER — FENTANYL CITRATE 0.05 MG/ML IJ SOLN
25.0000 ug | INTRAMUSCULAR | Status: DC | PRN
  Administered 2011-10-25: 50 ug via INTRAVENOUS

## 2011-10-25 MED ORDER — FENTANYL CITRATE 0.05 MG/ML IJ SOLN
INTRAMUSCULAR | Status: AC
  Filled 2011-10-25: qty 2

## 2011-10-25 MED ORDER — PHENYLEPHRINE HCL 10 MG/ML IJ SOLN
INTRAMUSCULAR | Status: DC | PRN
  Administered 2011-10-25 (×2): 80 ug via INTRAVENOUS
  Administered 2011-10-25: 40 ug via INTRAVENOUS
  Administered 2011-10-25: 80 ug via INTRAVENOUS
  Administered 2011-10-25: 40 ug via INTRAVENOUS
  Administered 2011-10-25 (×2): 80 ug via INTRAVENOUS
  Administered 2011-10-25: 40 ug via INTRAVENOUS
  Administered 2011-10-25: 80 ug via INTRAVENOUS

## 2011-10-25 MED ORDER — CEFAZOLIN SODIUM-DEXTROSE 2-3 GM-% IV SOLR
INTRAVENOUS | Status: DC | PRN
  Administered 2011-10-25: 2 g via INTRAVENOUS

## 2011-10-25 MED ORDER — FENTANYL CITRATE 0.05 MG/ML IJ SOLN
INTRAMUSCULAR | Status: AC
  Administered 2011-10-25: 50 ug via INTRAVENOUS
  Filled 2011-10-25: qty 2

## 2011-10-25 MED ORDER — MORPHINE SULFATE 0.5 MG/ML IJ SOLN
INTRAMUSCULAR | Status: AC
  Filled 2011-10-25: qty 10

## 2011-10-25 MED ORDER — OXYTOCIN 10 UNIT/ML IJ SOLN
INTRAMUSCULAR | Status: AC
  Filled 2011-10-25: qty 4

## 2011-10-25 SURGICAL SUPPLY — 39 items
CLOTH BEACON ORANGE TIMEOUT ST (SAFETY) ×2 IMPLANT
CONTAINER PREFILL 10% NBF 15ML (MISCELLANEOUS) IMPLANT
DRAPE SURG 17X23 STRL (DRAPES) ×2 IMPLANT
DRSG COVADERM 4X10 (GAUZE/BANDAGES/DRESSINGS) ×4 IMPLANT
DURAPREP 26ML APPLICATOR (WOUND CARE) ×2 IMPLANT
ELECT REM PT RETURN 9FT ADLT (ELECTROSURGICAL) ×2
ELECTRODE REM PT RTRN 9FT ADLT (ELECTROSURGICAL) ×1 IMPLANT
EXTRACTOR VACUUM KIWI (MISCELLANEOUS) IMPLANT
EXTRACTOR VACUUM M CUP 4 TUBE (SUCTIONS) IMPLANT
GLOVE BIO SURGEON ST LM GN SZ9 (GLOVE) ×4 IMPLANT
GLOVE BIO SURGEON STRL SZ 6.5 (GLOVE) ×2 IMPLANT
GLOVE BIOGEL PI IND STRL 7.0 (GLOVE) ×2 IMPLANT
GLOVE BIOGEL PI IND STRL 7.5 (GLOVE) ×1 IMPLANT
GLOVE BIOGEL PI INDICATOR 7.0 (GLOVE) ×2
GLOVE BIOGEL PI INDICATOR 7.5 (GLOVE) ×1
GLOVE SURG SS PI 7.5 STRL IVOR (GLOVE) ×2 IMPLANT
GOWN PREVENTION PLUS LG XLONG (DISPOSABLE) ×6 IMPLANT
GOWN STRL REIN 3XL LVL4 (GOWN DISPOSABLE) ×2 IMPLANT
KIT ABG SYR 3ML LUER SLIP (SYRINGE) ×2 IMPLANT
NEEDLE HYPO 25X5/8 SAFETYGLIDE (NEEDLE) ×2 IMPLANT
NS IRRIG 1000ML POUR BTL (IV SOLUTION) ×2 IMPLANT
PACK C SECTION WH (CUSTOM PROCEDURE TRAY) ×2 IMPLANT
PAD OB MATERNITY 4.3X12.25 (PERSONAL CARE ITEMS) ×2 IMPLANT
SLEEVE SCD COMPRESS KNEE MED (MISCELLANEOUS) IMPLANT
STAPLER VISISTAT 35W (STAPLE) ×2 IMPLANT
STRIP CLOSURE SKIN 1/2X4 (GAUZE/BANDAGES/DRESSINGS) IMPLANT
SUT MON AB 4-0 PS1 27 (SUTURE) IMPLANT
SUT PLAIN 0 NONE (SUTURE) IMPLANT
SUT PLAIN 2 0 (SUTURE) ×1
SUT PLAIN 2 0 XLH (SUTURE) IMPLANT
SUT PLAIN ABS 2-0 CT1 27XMFL (SUTURE) ×1 IMPLANT
SUT VIC AB 0 CT1 36 (SUTURE) ×2 IMPLANT
SUT VIC AB 0 CTX 36 (SUTURE) ×3
SUT VIC AB 0 CTX36XBRD ANBCTRL (SUTURE) ×3 IMPLANT
SUT VIC AB 2-0 CT1 27 (SUTURE) ×1
SUT VIC AB 2-0 CT1 TAPERPNT 27 (SUTURE) ×1 IMPLANT
TOWEL OR 17X24 6PK STRL BLUE (TOWEL DISPOSABLE) ×4 IMPLANT
TRAY FOLEY CATH 14FR (SET/KITS/TRAYS/PACK) ×2 IMPLANT
WATER STERILE IRR 1000ML POUR (IV SOLUTION) ×2 IMPLANT

## 2011-10-25 NOTE — Anesthesia Procedure Notes (Signed)
Spinal  Patient location during procedure: OR Start time: 10/25/2011 8:03 PM Staffing Anesthesiologist: Brayton Caves R Performed by: anesthesiologist  Preanesthetic Checklist Completed: patient identified, site marked, surgical consent, pre-op evaluation, timeout performed, IV checked, risks and benefits discussed and monitors and equipment checked Spinal Block Patient position: sitting Prep: DuraPrep Patient monitoring: heart rate, cardiac monitor, continuous pulse ox and blood pressure Approach: midline Location: L3-4 Injection technique: single-shot Needle Needle type: Sprotte  Needle gauge: 24 G Needle length: 9 cm Assessment Sensory level: T4 Additional Notes Patient identified.  Risk benefits discussed including failed block, incomplete pain control, headache, nerve damage, paralysis, blood pressure changes, nausea, vomiting, reactions to medication both toxic or allergic, and postpartum back pain.  Patient expressed understanding and wished to proceed.  All questions were answered.  Sterile technique used throughout procedure.  CSF was clear.  No parasthesia or other complications.  Please see nursing notes for vital signs.]

## 2011-10-25 NOTE — Progress Notes (Signed)
CTSP for bleeding  Known placenta previa, on nifedipine, started with increased cramping last night without vaginal bleeding. Patient had additional dose of Procardia last night. This a.m. patient noted increased cramping. When she arose to use the bathroom, she passed a fist-sized clot. Patient notes good fetal movement, uterine tightening, no uterine pain between contractions, no leakage of fluid. After passing the clot patient has not had any further vaginal bleeding.  PE: Filed Vitals:   10/24/11 2202 10/24/11 2312 10/25/11 0412 10/25/11 0944  BP: 88/57 100/65 90/58 117/78  Pulse: 70 79 78 70  Temp:    98.1 F (36.7 C)  TempSrc:    Oral  Resp: 18 18 18 18   Height:      Weight:      SpO2:       General: Lying flat mild anxious Abdomen: Gravid, soft, uterine irritability noted Lower extremities no swelling, no edema, no pain GU: Speculum exam deferred, very scant amount of blood on pad  Toco: 130s, positive decelerations, no decelerations, 10 beat variability Tachometry: Uterine irritability noted  Assessment and plan: This is a 31 year old G3 P1 011 at 11 and 5 here for monitoring of known placenta previa Bleeding: Patient has not had bleeding since September 2, now with fist-sized clot. Maternal and fetal status is R. stable at this moment. Will try to decrease uterine irritability with additional dose of Procardia now also 1 L IV fluid bolus. Patient to remain on continuous fetal monitoring and tocometry. Patient to remain on strict bed rest and will use a bedpan for bathroom. Repeat CBC and type and screen right now make patient n.p.o. Patient aware if bleeding increases or fetal status changes we may proceed with cesarean section today. Husband on his way.  Fetal well being overall reactive Continued inpatient care  Jeanetta Alonzo A. 10/25/2011 10:55 AM

## 2011-10-25 NOTE — Progress Notes (Signed)
Pt continues to contract and bleed. No response to procardia and fluids. Pt notes contractions getting more painful.  O: Gen: anxious, upset Abd: gravid, NT LE: NT, no edema Pulse: 85 Toco: q 2-3 min FH: 140's, + accels, no decels, 10 beat accels GU: continues to have moderate bleeding., clots  CBC    Component Value Date/Time   WBC 12.8* 10/25/2011 1835   RBC 3.66* 10/25/2011 1835   HGB 11.8* 10/25/2011 1835   HCT 35.0* 10/25/2011 1835   PLT 187 10/25/2011 1835   MCV 95.6 10/25/2011 1835   MCH 32.2 10/25/2011 1835   MCHC 33.7 10/25/2011 1835   RDW 13.8 10/25/2011 1835    A/P: 31 yo G3P1011 at 32'5 w/ continued painful contractions vaginal bleeding c/w preterm labor with placenta previa. - contractions worsening through the day, no response to IVF fluids and procardia, Now w/ consistent bleeding. Reactive fetal testing at this point. Long d/w pt and her husband regarding preterm labor with placenta previa. Given than she has been having progressively increasing contractions over the past 10 hrs, without ability to stop ctx, will move to delivery. Pt aware of risks to mom and baby, both surgical risks and prematurity.   Cbc stable, coags pending.   Eyvette Cordon A. 10/25/2011 7:10 PM

## 2011-10-25 NOTE — Transfer of Care (Signed)
Immediate Anesthesia Transfer of Care Note  Patient: Denise Hunt  Procedure(s) Performed: Procedure(s) (LRB) with comments: CESAREAN SECTION (N/A)  Patient Location: PACU  Anesthesia Type: Spinal  Level of Consciousness: awake, alert  and oriented  Airway & Oxygen Therapy: Patient Spontanous Breathing  Post-op Assessment: Report given to PACU RN and Post -op Vital signs reviewed and stable  Post vital signs: Reviewed and stable  Complications: No apparent anesthesia complications

## 2011-10-25 NOTE — Progress Notes (Signed)
S: Patient notes he continues to have intermittent small amounts of vaginal bleeding, mostly when wiping after voiding. Patient notes good fetal movement. Patient states continued contractions, now a pain score of 2 when prior was a pain score of 5. Patient notes continued contractions every 5 minutes  Objective: Filed Vitals:   10/25/11 0944 10/25/11 1204 10/25/11 1400 10/25/11 1559  BP: 117/78 103/63 105/60 99/68  Pulse: 70 76 76 76  Temp: 98.1 F (36.7 C)   98.1 F (36.7 C)  TempSrc: Oral   Oral  Resp: 18 18 18 18   Height:      Weight:      SpO2:       Abdomen: Soft, gravid, nontender GU: Very scant staining on the pad Tocometry: Contractions every 5 minutes Fetal heart: 140s, positive accelerations, positive variability, no decelerations  CBC    Component Value Date/Time   WBC 11.2* 10/25/2011 1025   RBC 3.85* 10/25/2011 1025   HGB 12.3 10/25/2011 1025   HCT 37.1 10/25/2011 1025   PLT 185 10/25/2011 1025   MCV 96.4 10/25/2011 1025   MCH 31.9 10/25/2011 1025   MCHC 33.2 10/25/2011 1025   RDW 13.9 10/25/2011 1025    Assessment and plan: A 31 year old G3 P1 011 with known placenta previa now with repeat bleeding at 32 weeks and 5 days. Bleeding is stable and minimal. Fetal testing remains reactive. We'll continue to watch expectantly. Patient continues on Procardia and IV hydration. If bleeding becomes heavy maternal or fetal testing is not reassuring will move to primary cesarean section. Patient is n.p.o. at this time on strict bedrest.   Denise Hunt A. 10/25/2011 4:05 PM

## 2011-10-25 NOTE — Anesthesia Postprocedure Evaluation (Signed)
Anesthesia Post Note  Patient: Denise Hunt  Procedure(s) Performed: Procedure(s) (LRB): CESAREAN SECTION (N/A)  Anesthesia type: Spinal  Patient location: PACU  Post pain: Pain level controlled  Post assessment: Post-op Vital signs reviewed  Last Vitals:  Filed Vitals:   10/25/11 2215  BP: 114/64  Pulse: 59  Temp:   Resp: 14    Post vital signs: Reviewed  Level of consciousness: awake  Complications: No apparent anesthesia complications

## 2011-10-25 NOTE — Anesthesia Preprocedure Evaluation (Signed)
Anesthesia Evaluation  Patient identified by MRN, date of birth, ID band Patient awake    Reviewed: Allergy & Precautions, H&P , Patient's Chart, lab work & pertinent test results  Airway Mallampati: II TM Distance: >3 FB Neck ROM: full    Dental No notable dental hx.    Pulmonary neg pulmonary ROS,  breath sounds clear to auscultation  Pulmonary exam normal       Cardiovascular hypertension, negative cardio ROS  Rhythm:regular Rate:Normal     Neuro/Psych negative neurological ROS  negative psych ROS   GI/Hepatic negative GI ROS, Neg liver ROS,   Endo/Other  negative endocrine ROS  Renal/GU negative Renal ROS     Musculoskeletal   Abdominal   Peds  Hematology negative hematology ROS (+)   Anesthesia Other Findings Previa complete  Reproductive/Obstetrics (+) Pregnancy                           Anesthesia Physical Anesthesia Plan  ASA: III and Emergent  Anesthesia Plan: Epidural   Post-op Pain Management:    Induction:   Airway Management Planned:   Additional Equipment:   Intra-op Plan:   Post-operative Plan:   Informed Consent: I have reviewed the patients History and Physical, chart, labs and discussed the procedure including the risks, benefits and alternatives for the proposed anesthesia with the patient or authorized representative who has indicated his/her understanding and acceptance.     Plan Discussed with:   Anesthesia Plan Comments:         Anesthesia Quick Evaluation

## 2011-10-25 NOTE — Op Note (Signed)
09/27/2011 - 10/25/2011  8:46 PM  PATIENT:  Denise Hunt  31 y.o. female  PRE-OPERATIVE DIAGNOSIS:  bleeding placenta previa, preterm labor, painful contractions, 32'[redacted] wks gestation  POST-OPERATIVE DIAGNOSIS:  bleedng placenta previa, preterm labor, painful contractions, 32'[redacted] wks gestation  PROCEDURE:  Procedure(s) (LRB) with comments: CESAREAN SECTION (N/A)  SURGEON:  Surgeon(s) and Role:    Tresa Endo A. Ernestina Penna, MD - Primary    * Tilda Burrow, MD - Assisting  PHYSICIAN ASSISTANT:   ASSISTANTSEmelda Fear, MD   ANESTHESIA:   spinal  EBL:  Total I/O In: 1300 [I.V.:1300] Out: 600 [Urine:100; Blood:500]  BLOOD ADMINISTERED:none  DRAINS: Urinary Catheter (Foley)   LOCAL MEDICATIONS USED:  NONE  SPECIMEN:  Source of Specimen:  placents  DISPOSITION OF SPECIMEN:  N/A  COUNTS:  YES  TOURNIQUET:  * No tourniquets in log *  DICTATION: .Note written in EPIC  PLAN OF CARE: Admit to inpatient   PATIENT DISPOSITION:  PACU - hemodynamically stable.   Delay start of Pharmacological VTE agent (>24hrs) due to surgical blood loss or risk of bleeding: yes   Indications: This is a 31 year old G3 P 1011 at 32 weeks and 5 days gestation who had been admitted for several weeks on the antepartum unit with placenta previa and intermittent bleeding. Patient has completed betamethasone with a single rescue dose, patient has also had magnesium sulfate earlier in the pregnancy for neuro prophylaxis. Patient has been maintained with intermittent contractions and scant vaginal bleeding over the past several weeks with IV hydration and Procardia. Starting last night patient began having increasing contractions. Additional doses of Procardia were given last night and again through the day. Later this afternoon the contractions became very regular every 3 minutes and painful. Patient related these contractions 7/10. Patient also began having persistent vaginal bleeding. She would gosh  approximately 100 cc of fresh blood in routinely throughout the afternoon. Patient was maintained on strict bed rest IV hydration and Procardia without resolution of the contractions or the bleeding. Decision was made given risk benefits at this gestational age to proceed with cesarean section. Hemoglobin was stable and coags were normal.   Procedure: CESAREAN SECTION CESAREAN SECTION   Findings:  @BABYSEXEBC @ infant,  APGAR (1 MIN): 7   APGAR (5 MINS): 8   APGAR (10 MINS):  32 week size uterus, clear amniotic fluid normal tubes and ovaries bilaterally, complete placenta previa, significant blood and clot in the cervix after the placenta was manually removed, cervix dilated 2-3 cm, the female fetus direct occiput anterior, weight pending   EBL: 500 cc Antibiotics:  2 g Ancef  Complications: none   Procedure:  After informed consent was obtained the patient was taken to the operating room where spinal  anesthesia was initiated.  She was prepped and draped in the normal sterile fashion in dorsal supine position with a leftward tilt.  A foley catheter was inserted sterilely into the bladder. Gloves were changes and attention was turned to the patient's abdomen. A Pfannenstiel skin incision was made 2 cm above the pubic symphysis in the midline with the scalpel.  Dissection was carried down with the Bovie cautery until the fascia was reached. The fascia was incised in the midline. The incision was extended laterally with the Mayo scissors. The inferior aspect of the fascial incision was grasped with the Coker clamps, elevated up and the underlying rectus muscles were dissected off sharply. The superior aspect of the fascial incision was grasped with the Coker clamps elevated  up and the underlying rectus muscles were dissected off sharply.  The peritoneum was entered bluntly. The peritoneal incision was extended superiorly and inferiorly with good visualization of the bladder. The bladder blade was  inserted. Palpation was done to assess the fetal position and the location of the uterine vessels and the placenta. The placenta was found to be low overlying the cervical os. We could easily identify the upper margin of the placenta and had surgical mapping to plan uterine incision above the placenta. This was still felt to be in the lower uterine segment. This was done after careful evaluation of the vessels to ensure adequate lateral room. The lower segment of the uterus was incised sharply with the scalpel and extended superiorly and laterally with the bandage scissors. T the amniotic sac was ruptured. . the infant also was grasped brought to the incision,  rotated and the infant was delivered with fundal pressure. The nose and mouth were bulb suctioned. The cord was clamped and cut. The infant was handed off to the waiting pediatrician. The placenta was expressed. The uterus was exteriorized. The uterus was cleared of all clots and debris. Significant blood and clot was noted in the cervical canal and the cervix was found to be 2-3 cm dilated consistent with the preoperative diagnosis of persistent preterm labor.  The uterine incision was repaired with 0 Vicryl in a running locked fashion.  A second layer of the same suture was used in an imbricating fashion to obtain excellent hemostasis.  The uterus was then returned to the abdomen, the gutters were cleared of all clots and debris. The uterine incision was reinspected and an additional figure-of-eight suture was placed in the left angle of the uterine incision. The uterus was then found to be hemostatic. The peritoneum was grasped and closed with 2-0 Vicryl in a running fashion. The cut muscle edges and the underside of the fascia were inspected and found to be hemostatic. The fascia was closed with 0 Vicryl in a single layer. The subcutaneous tissue was irrigated. Scarpa's layer was closed with a 2-0 plain gut suture. The skin was closed with staples. The  patient tolerated the procedure well. Sponge lap and needle counts were correct x3 and patient was taken to the recovery room in a stable condition.  Terrin Imparato A. 10/25/2011 8:48 PM

## 2011-10-25 NOTE — Brief Op Note (Signed)
09/27/2011 - 10/25/2011  8:46 PM  PATIENT:  Denise Hunt  31 y.o. female  PRE-OPERATIVE DIAGNOSIS:  bleeding placenta previa, preterm labor, painful contractions, 32'[redacted] wks gestation  POST-OPERATIVE DIAGNOSIS:  bleedng placenta previa, preterm labor, painful contractions, 32'[redacted] wks gestation  PROCEDURE:  Procedure(s) (LRB) with comments: CESAREAN SECTION (N/A)  SURGEON:  Surgeon(s) and Role:    Tresa Endo A. Ernestina Penna, MD - Primary    * Tilda Burrow, MD - Assisting  PHYSICIAN ASSISTANT:   ASSISTANTSEmelda Fear, MD   ANESTHESIA:   spinal  EBL:  Total I/O In: 1300 [I.V.:1300] Out: 600 [Urine:100; Blood:500]  BLOOD ADMINISTERED:none  DRAINS: Urinary Catheter (Foley)   LOCAL MEDICATIONS USED:  NONE  SPECIMEN:  Source of Specimen:  placents  DISPOSITION OF SPECIMEN:  N/A  COUNTS:  YES  TOURNIQUET:  * No tourniquets in log *  DICTATION: .Note written in EPIC  PLAN OF CARE: Admit to inpatient   PATIENT DISPOSITION:  PACU - hemodynamically stable.   Delay start of Pharmacological VTE agent (>24hrs) due to surgical blood loss or risk of bleeding: yes

## 2011-10-26 LAB — CBC
Platelets: 191 10*3/uL (ref 150–400)
RBC: 3.43 MIL/uL — ABNORMAL LOW (ref 3.87–5.11)
WBC: 15.2 10*3/uL — ABNORMAL HIGH (ref 4.0–10.5)

## 2011-10-26 MED ORDER — SIMETHICONE 80 MG PO CHEW
80.0000 mg | CHEWABLE_TABLET | Freq: Three times a day (TID) | ORAL | Status: DC
  Administered 2011-10-26 – 2011-10-29 (×12): 80 mg via ORAL

## 2011-10-26 MED ORDER — DIPHENHYDRAMINE HCL 50 MG/ML IJ SOLN: 12.5000 mg | INTRAMUSCULAR | Status: DC | PRN

## 2011-10-26 MED ORDER — OXYCODONE-ACETAMINOPHEN 5-325 MG PO TABS
1.0000 | ORAL_TABLET | ORAL | Status: DC | PRN
  Administered 2011-10-26 (×2): 2 via ORAL
  Filled 2011-10-26 (×2): qty 2

## 2011-10-26 MED ORDER — IBUPROFEN 600 MG PO TABS
600.0000 mg | ORAL_TABLET | Freq: Four times a day (QID) | ORAL | Status: DC
  Administered 2011-10-26 – 2011-10-27 (×5): 600 mg via ORAL
  Filled 2011-10-26 (×5): qty 1

## 2011-10-26 MED ORDER — METOCLOPRAMIDE HCL 5 MG/ML IJ SOLN: 10.0000 mg | Freq: Three times a day (TID) | INTRAMUSCULAR | Status: DC | PRN

## 2011-10-26 MED ORDER — ONDANSETRON HCL 4 MG/2ML IJ SOLN: 4.0000 mg | Freq: Three times a day (TID) | INTRAMUSCULAR | Status: DC | PRN

## 2011-10-26 MED ORDER — OXYTOCIN 40 UNITS IN LACTATED RINGERS INFUSION - SIMPLE MED: 62.5000 mL/h | INTRAVENOUS | Status: DC

## 2011-10-26 MED ORDER — NALOXONE HCL 0.4 MG/ML IJ SOLN: 0.4000 mg | INTRAMUSCULAR | Status: DC | PRN

## 2011-10-26 MED ORDER — DIPHENHYDRAMINE HCL 50 MG/ML IJ SOLN: 25.0000 mg | INTRAMUSCULAR | Status: DC | PRN

## 2011-10-26 MED ORDER — DIPHENHYDRAMINE HCL 25 MG PO CAPS
25.0000 mg | ORAL_CAPSULE | Freq: Four times a day (QID) | ORAL | Status: DC | PRN
  Administered 2011-10-26: 25 mg via ORAL
  Filled 2011-10-26 (×2): qty 1

## 2011-10-26 MED ORDER — SIMETHICONE 80 MG PO CHEW: 80.0000 mg | CHEWABLE_TABLET | ORAL | Status: DC | PRN

## 2011-10-26 MED ORDER — ACETAMINOPHEN 10 MG/ML IV SOLN
1000.0000 mg | Freq: Four times a day (QID) | INTRAVENOUS | Status: AC | PRN
  Filled 2011-10-26: qty 100

## 2011-10-26 MED ORDER — LACTATED RINGERS IV SOLN: INTRAVENOUS | Status: DC

## 2011-10-26 MED ORDER — SODIUM CHLORIDE 0.9 % IJ SOLN: 3.0000 mL | INTRAMUSCULAR | Status: DC | PRN

## 2011-10-26 MED ORDER — SODIUM CHLORIDE 0.9 % IV SOLN
1.0000 ug/kg/h | INTRAVENOUS | Status: DC | PRN
  Filled 2011-10-26: qty 2.5

## 2011-10-26 MED ORDER — SENNOSIDES-DOCUSATE SODIUM 8.6-50 MG PO TABS
2.0000 | ORAL_TABLET | Freq: Every day | ORAL | Status: DC
  Administered 2011-10-26 – 2011-10-28 (×3): 2 via ORAL

## 2011-10-26 MED ORDER — TETANUS-DIPHTH-ACELL PERTUSSIS 5-2.5-18.5 LF-MCG/0.5 IM SUSP
0.5000 mL | Freq: Once | INTRAMUSCULAR | Status: AC
  Administered 2011-10-28: 0.5 mL via INTRAMUSCULAR
  Filled 2011-10-26: qty 0.5

## 2011-10-26 MED ORDER — MENTHOL 3 MG MT LOZG: 1.0000 | LOZENGE | OROMUCOSAL | Status: DC | PRN

## 2011-10-26 MED ORDER — PRENATAL MULTIVITAMIN CH
1.0000 | ORAL_TABLET | Freq: Every day | ORAL | Status: DC
  Administered 2011-10-26 – 2011-10-29 (×4): 1 via ORAL
  Filled 2011-10-26 (×5): qty 1

## 2011-10-26 MED ORDER — LANOLIN HYDROUS EX OINT: 1.0000 "application " | TOPICAL_OINTMENT | CUTANEOUS | Status: DC | PRN

## 2011-10-26 MED ORDER — TETANUS-DIPHTH-ACELL PERTUSSIS 5-2.5-18.5 LF-MCG/0.5 IM SUSP: 0.5000 mL | Freq: Once | INTRAMUSCULAR | Status: DC

## 2011-10-26 MED ORDER — TRAMADOL HCL 50 MG PO TABS
50.0000 mg | ORAL_TABLET | Freq: Four times a day (QID) | ORAL | Status: DC | PRN
  Administered 2011-10-26 – 2011-10-29 (×10): 50 mg via ORAL
  Filled 2011-10-26 (×10): qty 1

## 2011-10-26 MED ORDER — DIBUCAINE 1 % RE OINT: 1.0000 "application " | TOPICAL_OINTMENT | RECTAL | Status: DC | PRN

## 2011-10-26 MED ORDER — TRAMADOL HCL 50 MG PO TABS: 50.0000 mg | ORAL_TABLET | Freq: Four times a day (QID) | ORAL | Status: DC

## 2011-10-26 MED ORDER — ZOLPIDEM TARTRATE 5 MG PO TABS: 5.0000 mg | ORAL_TABLET | Freq: Every evening | ORAL | Status: DC | PRN

## 2011-10-26 MED ORDER — NALBUPHINE SYRINGE 5 MG/0.5 ML
5.0000 mg | INJECTION | INTRAMUSCULAR | Status: DC | PRN
  Filled 2011-10-26: qty 1

## 2011-10-26 MED ORDER — LACTATED RINGERS IV SOLN
INTRAVENOUS | Status: DC
  Administered 2011-10-26: 05:00:00 via INTRAVENOUS

## 2011-10-26 MED ORDER — WITCH HAZEL-GLYCERIN EX PADS: 1.0000 "application " | MEDICATED_PAD | CUTANEOUS | Status: DC | PRN

## 2011-10-26 MED ORDER — DIPHENHYDRAMINE HCL 25 MG PO CAPS
25.0000 mg | ORAL_CAPSULE | ORAL | Status: DC | PRN
  Administered 2011-10-26: 25 mg via ORAL

## 2011-10-26 MED ORDER — ONDANSETRON HCL 4 MG PO TABS: 4.0000 mg | ORAL_TABLET | ORAL | Status: DC | PRN

## 2011-10-26 MED ORDER — ONDANSETRON HCL 4 MG/2ML IJ SOLN: 4.0000 mg | INTRAMUSCULAR | Status: DC | PRN

## 2011-10-26 MED ORDER — NALBUPHINE SYRINGE 5 MG/0.5 ML
5.0000 mg | INJECTION | INTRAMUSCULAR | Status: DC | PRN
  Administered 2011-10-26: 10 mg via INTRAVENOUS
  Filled 2011-10-26 (×2): qty 1

## 2011-10-26 NOTE — Anesthesia Postprocedure Evaluation (Signed)
  Anesthesia Post-op Note  Patient: Denise Hunt  Procedure(s) Performed: * No procedures listed *  Patient Location: PACU and Mother/Baby  Anesthesia Type: Spinal and Epidural  Level of Consciousness: awake, alert , oriented and patient cooperative  Airway and Oxygen Therapy: Patient Spontanous Breathing  Post-op Pain: none  Post-op Assessment: Post-op Vital signs reviewed and Patient's Cardiovascular Status Stable  Post-op Vital Signs: Reviewed and stable  Complications: No apparent anesthesia complications

## 2011-10-26 NOTE — Progress Notes (Signed)
POSTOPERATIVE DAY # 1 S/P cesarean section - 32 weeks with bleeding previa in labor   S:         Reports feeling well             Tolerating po intake / no  nausea / no  vomiting / + flatus / no  BM             Bleeding is spotting             Pain controlled with motrin and percocet             Up ad lib / ambulatory  Newborn stable in NICU   O:  A & O x 3              VS: Blood pressure 102/62, pulse 78, temperature 98.3 F (36.8 C), temperature source Oral, resp. rate 18, height 5\' 2"  (1.575 m), weight 60.873 kg (134 lb 3.2 oz), SpO2 99.00%, unknown if currently breastfeeding.  LABS: WBC/Hgb/Hct/Plts:  15.2/11.1/33.2/191 (09/21 0525)   I&O:  + 896  Lungs: Clear and unlabored  Heart: regular rate and rhythm / no mumurs  Abdomen: soft, non-tender, non-distended, bowel sounds active             Fundus: firm, non-tender, U-3             Dressing intact              Perineum: no edema  Lochia: light  Extremities: no edema, no calf pain or tenderness, negative Homans  A:        POD # 1 S/P cesarean section at 32-33 weeks            Newborn stable in NICU  P:        Routine postoperative care                 Marlinda Mike CNM, MSN 10/26/2011, 11:50 AM

## 2011-10-27 ENCOUNTER — Encounter (HOSPITAL_COMMUNITY): Payer: Self-pay | Admitting: Obstetrics

## 2011-10-27 MED ORDER — IBUPROFEN 800 MG PO TABS
800.0000 mg | ORAL_TABLET | Freq: Three times a day (TID) | ORAL | Status: DC
  Administered 2011-10-27 – 2011-10-29 (×7): 800 mg via ORAL
  Filled 2011-10-27 (×7): qty 1

## 2011-10-27 MED ORDER — HYDROMORPHONE HCL 2 MG PO TABS
1.0000 mg | ORAL_TABLET | ORAL | Status: DC | PRN
  Administered 2011-10-28 – 2011-10-29 (×9): 1 mg via ORAL
  Filled 2011-10-27 (×9): qty 1

## 2011-10-27 MED ORDER — HYDROMORPHONE HCL 2 MG PO TABS
1.0000 mg | ORAL_TABLET | Freq: Once | ORAL | Status: AC
  Administered 2011-10-27: 1 mg via ORAL
  Filled 2011-10-27: qty 1

## 2011-10-27 MED ORDER — HYDROMORPHONE HCL 2 MG PO TABS
1.0000 mg | ORAL_TABLET | Freq: Three times a day (TID) | ORAL | Status: DC | PRN
  Administered 2011-10-27: 1 mg via ORAL
  Filled 2011-10-27: qty 1

## 2011-10-27 MED ORDER — POLYETHYLENE GLYCOL 3350 17 G PO PACK
17.0000 g | PACK | Freq: Once | ORAL | Status: AC
  Administered 2011-10-27: 17 g via ORAL
  Filled 2011-10-27: qty 1

## 2011-10-27 MED ORDER — LORATADINE 10 MG PO TABS
10.0000 mg | ORAL_TABLET | Freq: Every day | ORAL | Status: DC
  Administered 2011-10-27 – 2011-10-29 (×3): 10 mg via ORAL
  Filled 2011-10-27 (×4): qty 1

## 2011-10-27 NOTE — Progress Notes (Signed)
Patient ID: Denise Hunt, female   DOB: 1980-03-21, 31 y.o.   MRN: [id number]  POSTOPERATIVE DAY # 2 S/P cesarean section   S:         Reports feeling more pain today - switched analgesia due to intense itching with percocet             Tolerating po intake / no  nausea / no vomiting / + flatus / no  BM             Bleeding is light             Pain controlled with motrin 600mg  and Ultram 50 mg             Up ad lib / ambulatory  Newborn stable in NICU - breastfeeding - pumping for feedings   O:  A & O x 3              VS: Blood pressure 89/66, pulse 66, temperature 97.9 F (36.6 C), temperature source Oral, resp. rate 18, height 5\' 2"  (1.575 m), weight 60.873 kg (134 lb 3.2 oz), SpO2 97.00%, unknown if currently breastfeeding.  Lungs: Clear and unlabored  Heart: regular rate and rhythm  Abdomen: soft, non-tender, non-distended, active BS             Fundus: firm, non-tender, U-3             Dressing OFF              Incision:  approximated with staples / no erythema / no ecchymosis / no drainage  Perineum: no edema  Lochia: light   Extremities: no edema, no calf pain or tenderness, negative Homans  A:        POD # 2 S/P cesarean section at 32-33 weeks / previa             P:        Routine postoperative care              Increase motrin and change to every 8 hours versus TID for better dosing     Marlinda Mike CNM, MSN 10/27/2011, 10:41 AM

## 2011-10-27 NOTE — Clinical Social Work Note (Signed)
Clinical Social Work Department PSYCHOSOCIAL ASSESSMENT - MATERNAL/CHILD 10/27/2011  Patient:  Denise Hunt,Denise Hunt  Account Number:  [id number]  Admit Date:  09/27/2011  Childs Name:   Kalyn Supak    Clinical Social Worker:  Josefa Syracuse, LCSW   Date/Time:  10/27/2011 02:00 PM  Date Referred:  10/27/2011   Referral source  Physician     Referred reason  NICU   Other referral source:    I:  FAMILY / HOME ENVIRONMENT Child's legal guardian:  PARENT  Guardian - Name Guardian - Age Guardian - Address  Hillary Miceli [address] [place] [address]  Charles Gohr  [address] [place] [address]   Other household support members/support persons Name Relationship DOB  30 year old     Other support:   lots of family support per MOB and FOB.    II  PSYCHOSOCIAL DATA Information Source:  Patient Interview  Financial and Community Resources Employment:   MOB been on bed rest for 3 weeks.   Financial resources:  Private Insurance If Medicaid - County:    School / Grade:   Maternity Care Coordinator / Child Services Coordination / Early Interventions:  Cultural issues impacting care:    III  STRENGTHS Strengths  Adequate Resources  Supportive family/friends  Home prepared for Child (including basic supplies)  Compliance with medical plan  Understanding of illness   Strength comment:    IV  RISK FACTORS AND CURRENT PROBLEMS Current Problem:  None   Risk Factor & Current Problem Patient Issue Family Issue Risk Factor / Current Problem Comment   N N     V  SOCIAL WORK ASSESSMENT CSW spoke with MOB and FOB in room.  SW discussed admission to NICU and understanding of diagnosis.  MOB and FOB report no emotional concerns at this time and reported they understand infants treatment.  CSW discussed SW role in the NICU and support while infant is there.  MOB and FOB live in [place], [place].  SW discussed family support and supplies. MOB and FOB report good  family support and another child at their home.  No hx of drug use and no current concerns. MOB and FOB did not voices and issues or cocnerns to SW. CSW will continue to follow while infant is in NICU to provide support when needed.      VI SOCIAL WORK PLAN Social Work Plan  Psychosocial Support/Ongoing Assessment of Needs   Type of pt/family education:   If child protective services report - county:   If child protective services report - date:   Information/referral to community resources comment:   Other social work plan:    

## 2011-10-28 MED ORDER — POLYETHYLENE GLYCOL 3350 17 G PO PACK
17.0000 g | PACK | Freq: Once | ORAL | Status: AC
  Administered 2011-10-28: 17 g via ORAL
  Filled 2011-10-28: qty 1

## 2011-10-28 NOTE — Progress Notes (Signed)
POSTOPERATIVE DAY # 3S/P cesarean section - previa at 32-33 weeks Delivery PM of 20th (72 hours this pm)   S:         Reports feeling really sore with generalized abdominal pain and swelling / incision burning             Tolerating po intake / no  nausea / no  vomiting / + flatus / no BM             Bleeding is spotting             Pain minimally controlled withmotrin (800) and Tramadol (50) and dilaudid prn             Up ad lib / ambulatory some / voiding QS  Newborn NICU  O:  A & O x 3              VS: Blood pressure 109/68, pulse 78, temperature 97.9 F (36.6 C), temperature source Oral, resp. rate 18, height 5\' 2"  (1.575 m), weight 60.873 kg (134 lb 3.2 oz), SpO2 99.00%, unknown if currently breastfeeding.  Lungs: Clear and unlabored  Heart: regular rate and rhythm / no mumurs  Abdomen: soft, non-tender, Mildly distended, active BS             Fundus: firm, mildly tender, U-3             Dressing OFF             Incision:  approximated with staples / no erythema / no ecchymosis / no drainage  Perineum: no edema  Lochia: spotting  Extremities: no edema, no calf pain or tenderness  A:        POD # 2-3 S/P cesarean section            Minimally controlled pain / Gas pains  P:        Routine postoperative care              Schedule motrin and tramadol for alternating doses  / dilaudid for retractable pain only             Miralax for bowel motility - instructed warm fluids and ambulation today             Discharge in AM if pain controlled    Marlinda Mike CNM, MSN 10/28/2011, 9:20 AM

## 2011-10-28 NOTE — Progress Notes (Signed)
SW received message from chaplain that parents may be interested in [place] since they live in [place].  SW called MOB who states this will not be necessary since they have a two year old at home.  They plan to commute back and forth.  She was very pleasant and appreciative.

## 2011-10-28 NOTE — Progress Notes (Signed)
Denise Hunt was in good spirits and was happy that her baby is here and that she is doing well.  They are balancing having a 30 year old at home in [place] where they live and having their baby here in the NICU.  They have good family support and seem to be coping well with the situation.  We will continue to follow-up with them when we see them in the NICU.  [address] [place] Pager, [phone] 11:11 AM   10/28/11 1100  Clinical Encounter Type  Visited With Patient  Visit Type Spiritual support

## 2011-10-29 LAB — TYPE AND SCREEN
ABO/RH(D): A POS
Antibody Screen: NEGATIVE
Unit division: 0

## 2011-10-29 MED ORDER — HYDROMORPHONE HCL 2 MG PO TABS: 2.0000 mg | ORAL_TABLET | ORAL | Status: DC | PRN

## 2011-10-29 MED ORDER — TRAMADOL HCL 50 MG PO TABS: 50.0000 mg | ORAL_TABLET | Freq: Four times a day (QID) | ORAL | Status: DC | PRN

## 2011-10-29 MED ORDER — IBUPROFEN 800 MG PO TABS: 800.0000 mg | ORAL_TABLET | Freq: Three times a day (TID) | ORAL | Status: AC

## 2011-10-29 NOTE — Discharge Summary (Signed)
Pt DOES NOT have hypertension, procardia was for symptomatic contractions, PTL and bleeding from previa.

## 2011-10-29 NOTE — Progress Notes (Signed)
UR Chart review completed.  

## 2011-10-29 NOTE — Progress Notes (Signed)
Subjective: POD# 4 Information for the patient's newborn:  Celine, Dishman Girl Sharolyn [[id number]]  female Stable / NICU / prematurity  Reports feeling better today, some pain at staples site, feels like tugging. Feeding: breast, pumping Patient reports tolerating PO.  Breast symptoms: colostrum Pain controlled with Motrin, Ultram and Dilaudid Denies HA/SOB/C/P/N/V/dizziness. Flatus present. She reports vaginal bleeding as normal, without clots.  She is ambulating, urinating without difficult.     Objective:   VS:  Filed Vitals:   10/28/11 1200 10/28/11 1800 10/28/11 2159 10/29/11 0532  BP: 115/74 110/75 118/79 113/85  Pulse: 80 70 86 92  Temp: 97.8 F (36.6 C) 98 F (36.7 C) 98 F (36.7 C) 98.2 F (36.8 C)  TempSrc: Oral Oral Oral Oral  Resp: 18 18 18 18   Height:      Weight:      SpO2: 100% 97% 100% 100%     Lab Results  Component Value Date   WBC 15.2* 10/26/2011   HGB 11.1* 10/26/2011   HCT 33.2* 10/26/2011   MCV 96.8 10/26/2011   PLT 191 10/26/2011        Blood type: --/--/A POS (09/20 1025)  Rubella: Immune (08/23 1859)     Physical Exam:  General: alert, cooperative and no distress CV: Regular rate and rhythm Resp: clear Abdomen: soft, nontender, normal bowel sounds Incision: dry, intact and staplesremoved and replaced w/ steri strips Uterine Fundus: firm, below umbilicus, nontender Lochia: minimal Ext: Homans sign is negative, no sign of DVT and no edema, redness or tenderness in the calves or thighs      Assessment/Plan: 31 y.o.   POD# 4.  s/p Cesarean Delivery.  Indications: previa, active bleed w/ PTL                Active Problems:  Postpartum care following CS at 32 weeks (9/20)  Doing well, stable.    Pain control improved Routine post-op care DC home w/ WOB instructions.  PAUL,DANIELA 10/29/2011, 11:01 AM

## 2011-10-29 NOTE — Discharge Summary (Signed)
POSTOPERATIVE DISCHARGE SUMMARY:  Patient ID: Denise Hunt MRN: [id number] DOB/AGE: [birth year] 31 y.o.  Admit date: 09/27/2011 Discharge date:  10/29/2011   Admission Diagnoses: 1. 28 5/7 weeks pregnancy with active vaginal bleed  2. Placenta previa  Discharge Diagnoses:  1. Status post Primary cesarean section at 32 5/7 weeks for preterm labor and placenta previa 2. Post operative day 4, stable  Prenatal history: [id number]   EDC : 12/15/2011, by Other Basis  P[facility]atal care at [facility] since [redacted] weeks gestation  Prenatal course complicated by complete placenta previa.  Prenatal Labs: ABO, Rh: A (08/23 1859)  Antibody: NEG (09/20 1025) Rubella: Immune (08/23 1859)  RPR: Nonreactive (08/23 1859)  HBsAg: Negative (08/23 1859)  HIV: Non-reactive (08/23 1859)  GBS:   unknown 1 hr Glucola : abnormal, passed 3GTT   Medical / Surgical History :  Past medical history:  Past Medical History  Diagnosis Date  . Pregnancy induced hypertension   . Urinary tract infection     Past surgical history:  Past Surgical History  Procedure Date  . Dilation and curettage of uterus   . Cesarean section 10/25/2011    Procedure: CESAREAN SECTION;  Surgeon: Tresa Endo A. Ernestina Penna, MD;  Location: [facility];  Service: Obstetrics;  Laterality: N/A;    Family History:  Family History  Problem Relation Age of Onset  . Other Neg Hx     Social History:  reports that she has never smoked. She has never used smokeless tobacco. She reports that she drinks alcohol. She reports that she does not use illicit drugs.   Allergies: Labetalol    Current Medications at time of admission:  Prescriptions prior to admission  Medication Sig Dispense Refill  . Prenatal Vit-Fe Fumarate-FA (PRENATAL MULTIVITAMIN) TABS Take 1 tablet by mouth at bedtime.            Intrapartum Course:  Patient was admitted to antepartum unit at 28 5/7 weeks with acute vaginal bleeding episode, her second  in 30 days, with known complete placenta previa. She was hemodynamically stable and fetal antenatal testing remained reassuring; she received short course of oraL Indomethacin and Magnesium Sulfate intravenous infusion for fetal neuroprophylaxis x 48 hours. Inpatient status continued for 28 days with intermittent sonographic fetal growth scans and fetal electronic monitoring on modified bed rest. At 32 5/[redacted] weeks gestation, preterm contractions resumed with active vaginal bleed refractory to Procardia and intravenous fluids. Decision was made to proceed with cesarean section and patient was counseled accordingly.  Procedures: Cesarean section delivery of female newborn by Dr Ernestina Penna  See operative report for further details  Postoperative / postpartum course: uneventful  Physical Exam:  VSS: Blood pressure 113/85, pulse 92, temperature 98.2 F (36.8 C), temperature source Oral, resp. rate 18, height 5\' 2"  (1.575 m), weight 60.873 kg (134 lb 3.2 oz), SpO2 100.00%, unknown if currently breastfeeding.   LABS: No results found for this basename: WBC:2,HGB:2,HCT:2,PLT:2 in the last 72 hours  I&O:        Incision:  approximated with staples / no erythema / no ecchymosis / no drainage Staples: removed prior to discharge and replaced with steri strips  Discharge Instructions:  Discharged Condition: good Activity: pelvic rest, weight lifting and driving restrictions x 2 weeks Diet: routine Medications:    Medication List     As of 10/29/2011 11:20 AM    TAKE these medications         ibuprofen 800 MG tablet   Commonly known as: ADVIL,MOTRIN  Take 1 tablet (800 mg total) by mouth every 8 (eight) hours.      prenatal multivitamin Tabs   Take 1 tablet by mouth at bedtime.      traMADol 50 MG tablet   Commonly known as: ULTRAM   Take 1-2 tablets (50-100 mg total) by mouth every 6 (six) hours as needed for pain.       Condition: stable Postpartum Instructions: refer to practice  specific booklet Discharge to: home Disposition:  Follow up :      Follow-up Information    Follow up with [facility] MD. Schedule an appointment as soon as possible for a visit in 6 weeks.   Contact information:   [address] [place] [address] [phone]           Signed: Arlan Organ 10/29/2011, 11:20 AM

## 2011-10-30 NOTE — Progress Notes (Signed)
Post discharge review completed. 

## 2011-11-18 ENCOUNTER — Encounter (HOSPITAL_COMMUNITY)
Admission: AD | Disposition: A | Discharge status: Hospice | Payer: Self-pay | Source: Ambulatory Visit | Attending: Obstetrics and Gynecology

## 2011-11-18 SURGERY — Surgical Case
Anesthesia: Spinal

## 2013-03-24 IMAGING — US US OB COMP +14 WK
1 series · 12 of 28 positions shown · non-contrast
Comparison: none

[Series 1: us ob comp +14 wk · 0.21mm/px · 12 of 83 slices shown]
[im 4/83]
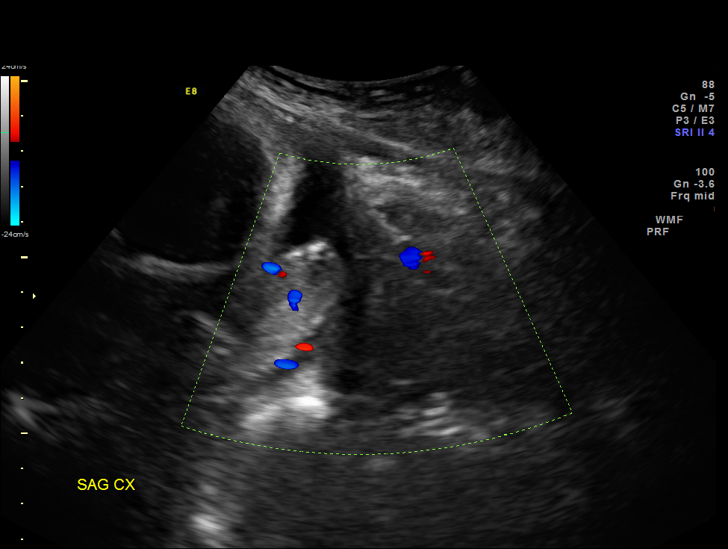
[im 10/83]
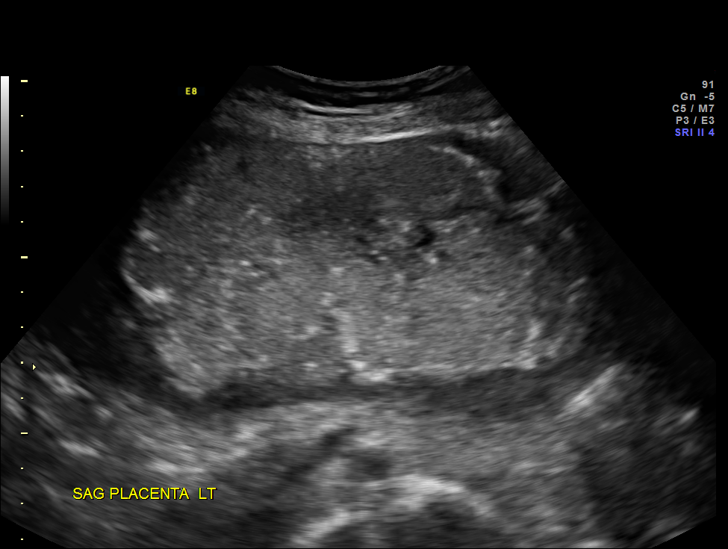
[im 16/83]
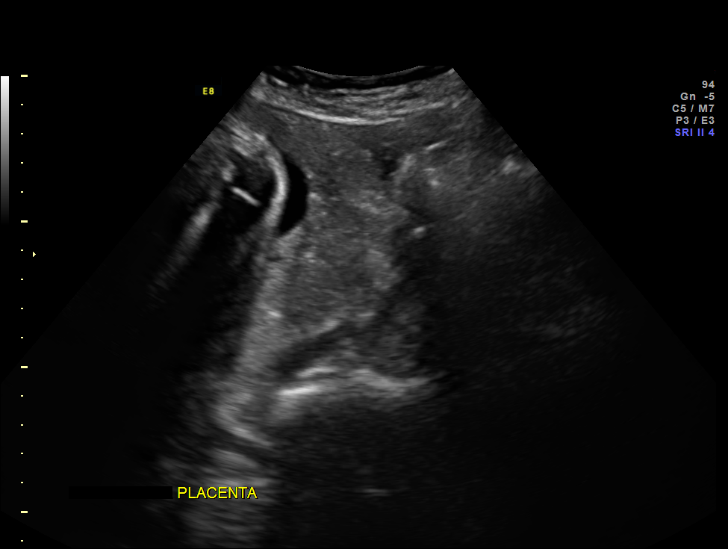
[im 25/83]
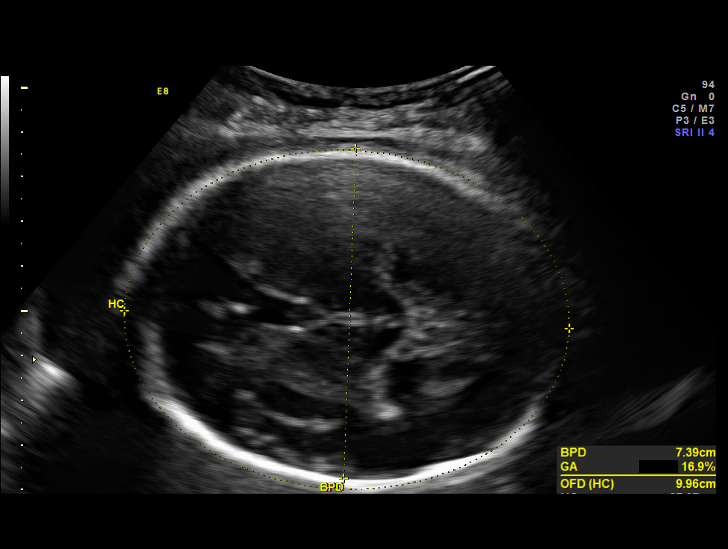
[im 31/83]
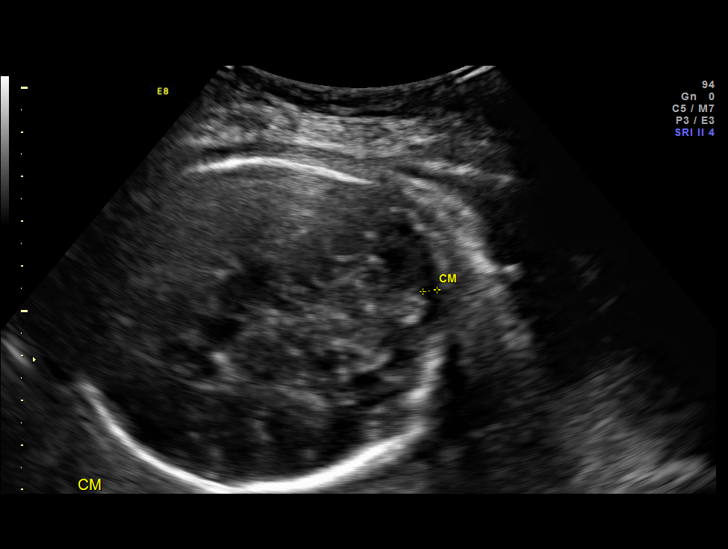
[im 37/83]
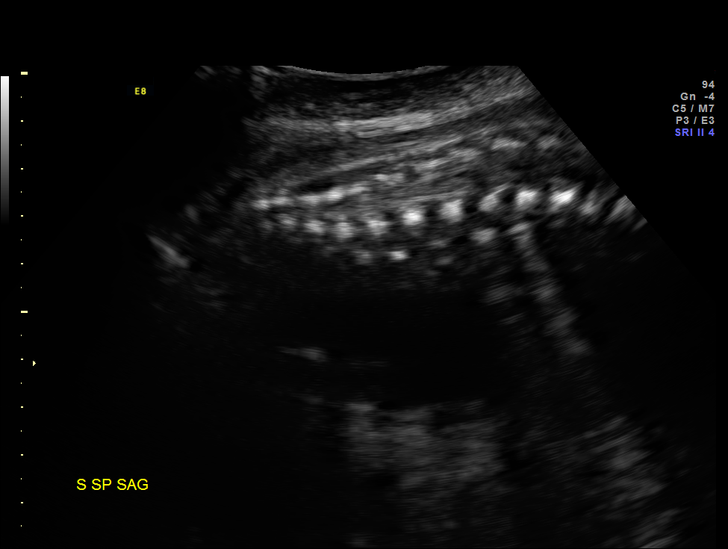
[im 46/83]
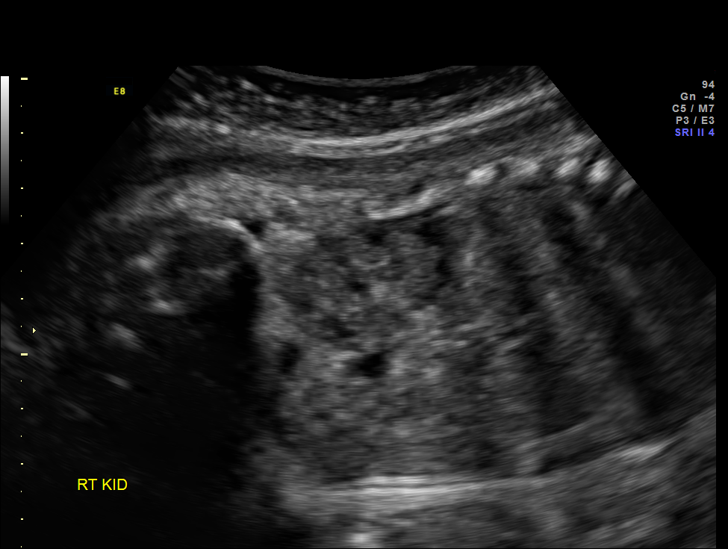
[im 52/83]
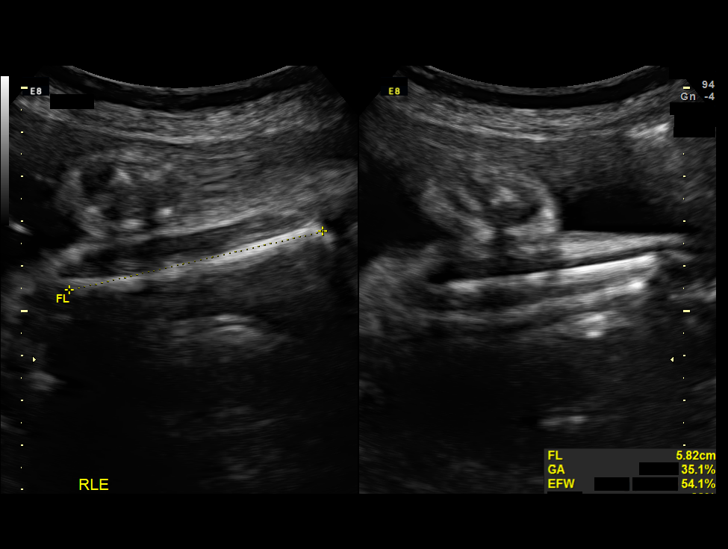
[im 58/83]
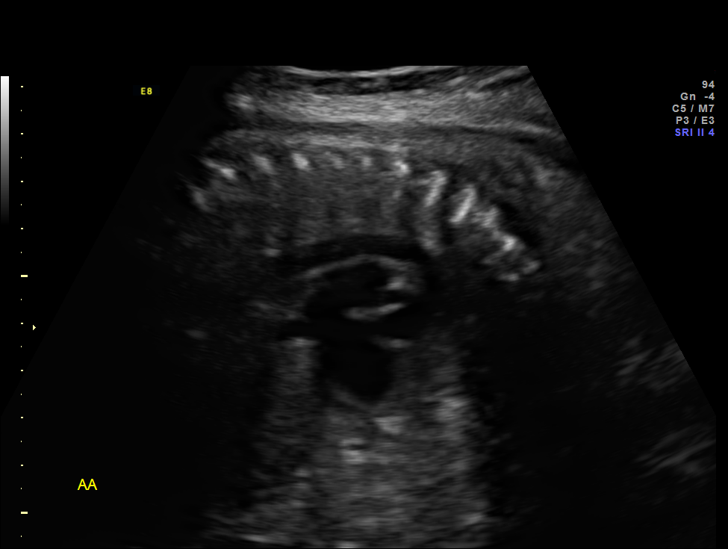
[im 67/83]
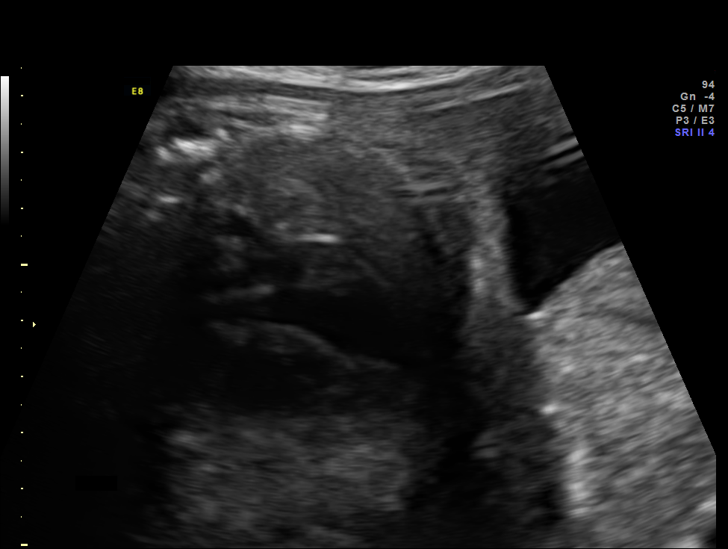
[im 73/83]
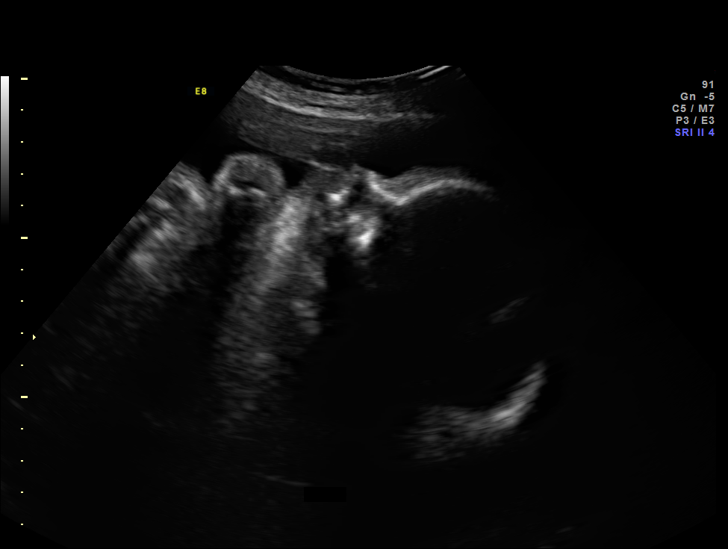
[im 79/83]
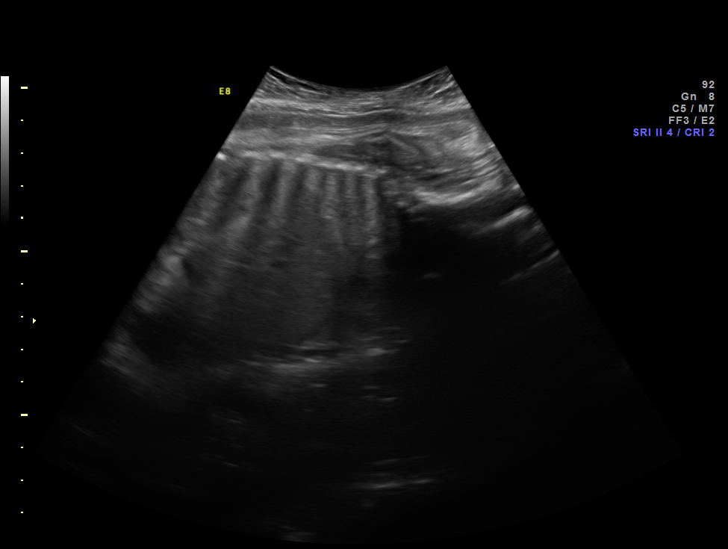

[12 of 28 positions shown; findings below may reference images not displayed]

OBSTETRICS REPORT
                      (Signed Final 10/09/2011 [DATE])

 Order#:         [id number]
Procedures

 US OB COMP + 14 WK                                    76805.1
Indications

 Basic anatomic survey
 Placenta previa/Low lying: Bleeding
Fetal Evaluation

 Fetal Heart Rate:  127                          bpm
 Cardiac Activity:  Observed
 Presentation:      Cephalic
 Placenta:          Posterior Previa
 P. Cord            Visualized, central
 Insertion:

 Amniotic Fluid
 AFI FV:      Subjectively within normal limits
                                             Larg Pckt:     5.2  cm
Biometry

 BPD:     73.7  mm     G. Age:  29w 4d                CI:         74.7   70 - 86
 OFD:     98.7  mm                                    FL/HC:      21.5   19.2 -

 HC:     275.5  mm     G. Age:  30w 1d       11  %    HC/AC:      1.03   0.99 -

 AC:     266.7  mm     G. Age:  30w 5d       56  %    FL/BPD:     80.3   71 - 87
 FL:      59.2  mm     G. Age:  30w 6d       48  %    FL/AC:      22.2   20 - 24
 HUM:     53.6  mm     G. Age:  31w 1d       67  %
 CER:     37.7  mm     G. Age:  32w 2d       76  %

 Est. FW:    5859  gm      3 lb 9 oz     58  %
Gestational Age

 LMP:           34w 4d        Date:  02/09/11                 EDD:   11/16/11
 U/S Today:     30w 2d                                        EDD:   12/16/11
 Best:          30w 3d     Det. By:  Early Ultrasound         EDD:   12/15/11
Anatomy
 Cranium:           Appears normal      Aortic Arch:       Appears normal
 Fetal Cavum:       Appears normal      Ductal Arch:       Appears normal
 Ventricles:        Appears normal      Diaphragm:         Appears normal
 Choroid Plexus:    Appears normal      Stomach:           Appears normal
 Cerebellum:        Appears normal      Abdomen:           Appears normal
 Posterior Fossa:   Appears normal      Abdominal Wall:    Appears nml
                                                           (cord insert,
                                                           abd wall)
 Nuchal Fold:       Not applicable      Cord Vessels:      Appears normal
                    (>20 wks GA)                           (3 vessel cord)
 Face:              Appears normal      Kidneys:           Appear normal
                    (lips/profile/orbit
                    s)
 Heart:             Appears normal      Bladder:           Appears normal
                    (4 chamber &
                    axis)
 RVOT:              Appears normal      Spine:             Appears normal
 LVOT:              Appears normal      Limbs:             Four extremities
                                                           seen

 Other:     Technically difficult due to advanced GA and fetal
            position.
Targeted Anatomy

 Fetal Central Nervous System
 Cisterna Magna:
Cervix Uterus Adnexa

 Cervical Length:    3.4      cm

 Cervix:       Normal appearance by transabdominal scan.
 Left Ovary:    Within normal limits.
 Right Ovary:   Within normal limits.

 Adnexa:     No abnormality visualized.
Impression

 IUP at 30+3 weeks
 Normal detailed fetal anatomy
 Normal amniotic fluid volume
 Measurements consistent with prior US; EFW at the 58th
 %tile
 TA views of cervix and placenta: partial to complete posterior
 placenta previa
Recommendations

 Serial Rm for growth

## 2013-12-06 ENCOUNTER — Encounter (HOSPITAL_COMMUNITY): Payer: Self-pay | Admitting: Obstetrics

## 2017-12-04 LAB — OB RESULTS CONSOLE HIV ANTIBODY (ROUTINE TESTING): HIV: NONREACTIVE

## 2017-12-04 LAB — OB RESULTS CONSOLE ABO/RH: RH Type: POSITIVE

## 2017-12-04 LAB — OB RESULTS CONSOLE RUBELLA ANTIBODY, IGM: Rubella: IMMUNE

## 2017-12-04 LAB — OB RESULTS CONSOLE GC/CHLAMYDIA
Chlamydia: NEGATIVE
Gonorrhea: NEGATIVE

## 2017-12-04 LAB — OB RESULTS CONSOLE ANTIBODY SCREEN: Antibody Screen: POSITIVE

## 2017-12-04 LAB — OB RESULTS CONSOLE RPR: RPR: NONREACTIVE

## 2017-12-04 LAB — OB RESULTS CONSOLE HEPATITIS B SURFACE ANTIGEN: Hepatitis B Surface Ag: NEGATIVE

## 2017-12-12 ENCOUNTER — Other Ambulatory Visit: Payer: Self-pay | Admitting: Obstetrics

## 2017-12-15 ENCOUNTER — Other Ambulatory Visit (HOSPITAL_COMMUNITY)
Admission: AD | Admit: 2017-12-15 | Discharge: 2017-12-15 | Disposition: A | Discharge status: Home / Self Care | Payer: BLUE CROSS/BLUE SHIELD | Source: Ambulatory Visit | Attending: Obstetrics | Admitting: Obstetrics

## 2017-12-15 ENCOUNTER — Other Ambulatory Visit: Payer: Self-pay | Admitting: Obstetrics and Gynecology

## 2017-12-15 DIAGNOSIS — R899 Unspecified abnormal finding in specimens from other organs, systems and tissues: Secondary | ICD-10-CM | POA: Diagnosis not present

## 2017-12-15 LAB — ANTIBODY SCREEN: ANTIBODY SCREEN: NEGATIVE

## 2018-02-04 NOTE — L&D Delivery Note (Signed)
Delivery Note At 8:41 AM a viable female was delivered via Vaginal, Spontaneous.  Vaginal birth after cesarean (Presentation:LOA ;  ).  APGAR: per nursing, notes, adequate cry, good tone, ; weight  . pending  Placenta status: Spontaneous, intact, .  Cord: Three-vessel cord with the following complications: .  Cord pH: Not indicated  Anesthesia: Epidural Episiotomy: None Lacerations: 1st degree Suture Repair: 2 figure-of-eight's of 3-0 Vicryl Rapide Est. Blood Loss (mL): 50 cc per my estimate  Mom to postpartum.  Baby to Couplet care / Skin to Skin.  Lendon Colonel 06/28/2018, 8:50 AM

## 2018-05-27 LAB — OB RESULTS CONSOLE GBS: GBS: NEGATIVE

## 2018-06-19 ENCOUNTER — Telehealth (HOSPITAL_COMMUNITY): Payer: Self-pay | Admitting: *Deleted

## 2018-06-19 ENCOUNTER — Encounter (HOSPITAL_COMMUNITY): Payer: Self-pay | Admitting: *Deleted

## 2018-06-19 NOTE — Telephone Encounter (Signed)
Preadmission screen  

## 2018-06-22 ENCOUNTER — Telehealth (HOSPITAL_COMMUNITY): Payer: Self-pay | Admitting: *Deleted

## 2018-06-22 ENCOUNTER — Encounter (HOSPITAL_COMMUNITY): Payer: Self-pay | Admitting: *Deleted

## 2018-06-22 NOTE — Telephone Encounter (Signed)
Preadmission screen  

## 2018-06-25 ENCOUNTER — Other Ambulatory Visit: Payer: Self-pay | Admitting: Obstetrics

## 2018-06-28 ENCOUNTER — Inpatient Hospital Stay (HOSPITAL_COMMUNITY)
Admission: AD | Admit: 2018-06-28 | Discharge: 2018-06-29 | DRG: 807 | Disposition: A | Discharge status: Home / Self Care | Payer: BLUE CROSS/BLUE SHIELD | Attending: Obstetrics | Admitting: Obstetrics

## 2018-06-28 ENCOUNTER — Encounter (HOSPITAL_COMMUNITY): Payer: Self-pay

## 2018-06-28 ENCOUNTER — Other Ambulatory Visit: Payer: Self-pay

## 2018-06-28 ENCOUNTER — Inpatient Hospital Stay (HOSPITAL_COMMUNITY): Payer: BLUE CROSS/BLUE SHIELD | Admitting: Anesthesiology

## 2018-06-28 DIAGNOSIS — O34219 Maternal care for unspecified type scar from previous cesarean delivery: Secondary | ICD-10-CM | POA: Diagnosis present

## 2018-06-28 DIAGNOSIS — Z3490 Encounter for supervision of normal pregnancy, unspecified, unspecified trimester: Secondary | ICD-10-CM | POA: Diagnosis present

## 2018-06-28 DIAGNOSIS — Z1159 Encounter for screening for other viral diseases: Secondary | ICD-10-CM | POA: Diagnosis not present

## 2018-06-28 DIAGNOSIS — Z3A39 39 weeks gestation of pregnancy: Secondary | ICD-10-CM

## 2018-06-28 DIAGNOSIS — O26893 Other specified pregnancy related conditions, third trimester: Secondary | ICD-10-CM | POA: Diagnosis present

## 2018-06-28 LAB — RPR: RPR Ser Ql: NONREACTIVE

## 2018-06-28 LAB — CBC
HCT: 37.5 % (ref 36.0–46.0)
Hemoglobin: 12.9 g/dL (ref 12.0–15.0)
MCH: 32.8 pg (ref 26.0–34.0)
MCHC: 34.4 g/dL (ref 30.0–36.0)
MCV: 95.4 fL (ref 80.0–100.0)
Platelets: 191 10*3/uL (ref 150–400)
RBC: 3.93 MIL/uL (ref 3.87–5.11)
RDW: 13.4 % (ref 11.5–15.5)
WBC: 12.8 10*3/uL — ABNORMAL HIGH (ref 4.0–10.5)
nRBC: 0 % (ref 0.0–0.2)

## 2018-06-28 LAB — SARS CORONAVIRUS 2 BY RT PCR (HOSPITAL ORDER, PERFORMED IN ~~LOC~~ HOSPITAL LAB): SARS Coronavirus 2: NEGATIVE

## 2018-06-28 LAB — POCT FERN TEST: POCT Fern Test: POSITIVE

## 2018-06-28 MED ORDER — OXYTOCIN 40 UNITS IN NORMAL SALINE INFUSION - SIMPLE MED
INTRAVENOUS | Status: AC
  Administered 2018-06-28: 500 mL via INTRAVENOUS
  Filled 2018-06-28: qty 1000

## 2018-06-28 MED ORDER — LIDOCAINE HCL (PF) 1 % IJ SOLN: INTRAMUSCULAR | Status: DC | PRN

## 2018-06-28 MED ORDER — SOD CITRATE-CITRIC ACID 500-334 MG/5ML PO SOLN: 30.0000 mL | ORAL | Status: DC | PRN

## 2018-06-28 MED ORDER — BENZOCAINE-MENTHOL 20-0.5 % EX AERO
1.0000 "application " | INHALATION_SPRAY | CUTANEOUS | Status: DC | PRN
  Administered 2018-06-28: 1 via TOPICAL

## 2018-06-28 MED ORDER — DIPHENHYDRAMINE HCL 50 MG/ML IJ SOLN: 12.5000 mg | INTRAMUSCULAR | Status: DC | PRN

## 2018-06-28 MED ORDER — SIMETHICONE 80 MG PO CHEW: 80.0000 mg | CHEWABLE_TABLET | ORAL | Status: DC | PRN

## 2018-06-28 MED ORDER — ZOLPIDEM TARTRATE 5 MG PO TABS: 5.0000 mg | ORAL_TABLET | Freq: Every evening | ORAL | Status: DC | PRN

## 2018-06-28 MED ORDER — ACETAMINOPHEN 325 MG PO TABS: 650.0000 mg | ORAL_TABLET | ORAL | Status: DC | PRN

## 2018-06-28 MED ORDER — ONDANSETRON HCL 4 MG/2ML IJ SOLN: 4.0000 mg | INTRAMUSCULAR | Status: DC | PRN

## 2018-06-28 MED ORDER — LIDOCAINE HCL (PF) 1 % IJ SOLN: 30.0000 mL | INTRAMUSCULAR | Status: DC | PRN

## 2018-06-28 MED ORDER — OXYTOCIN 40 UNITS IN NORMAL SALINE INFUSION - SIMPLE MED: 1.0000 m[IU]/min | INTRAVENOUS | Status: DC

## 2018-06-28 MED ORDER — LACTATED RINGERS IV SOLN
500.0000 mL | Freq: Once | INTRAVENOUS | Status: AC
  Administered 2018-06-28: 08:00:00 500 mL via INTRAVENOUS

## 2018-06-28 MED ORDER — ONDANSETRON HCL 4 MG PO TABS: 4.0000 mg | ORAL_TABLET | ORAL | Status: DC | PRN

## 2018-06-28 MED ORDER — WITCH HAZEL-GLYCERIN EX PADS: 1.0000 "application " | MEDICATED_PAD | CUTANEOUS | Status: DC | PRN

## 2018-06-28 MED ORDER — OXYCODONE HCL 5 MG PO TABS: 10.0000 mg | ORAL_TABLET | ORAL | Status: DC | PRN

## 2018-06-28 MED ORDER — COCONUT OIL OIL: 1.0000 "application " | TOPICAL_OIL | Status: DC | PRN

## 2018-06-28 MED ORDER — EPHEDRINE 5 MG/ML INJ: 10.0000 mg | INTRAVENOUS | Status: DC | PRN

## 2018-06-28 MED ORDER — PHENYLEPHRINE 40 MCG/ML (10ML) SYRINGE FOR IV PUSH (FOR BLOOD PRESSURE SUPPORT): 80.0000 ug | PREFILLED_SYRINGE | INTRAVENOUS | Status: DC | PRN

## 2018-06-28 MED ORDER — FENTANYL-BUPIVACAINE-NACL 0.5-0.125-0.9 MG/250ML-% EP SOLN: 12.0000 mL/h | EPIDURAL | Status: DC | PRN

## 2018-06-28 MED ORDER — DIBUCAINE (PERIANAL) 1 % EX OINT: 1.0000 "application " | TOPICAL_OINTMENT | CUTANEOUS | Status: DC | PRN

## 2018-06-28 MED ORDER — DIPHENHYDRAMINE HCL 25 MG PO CAPS: 25.0000 mg | ORAL_CAPSULE | Freq: Four times a day (QID) | ORAL | Status: DC | PRN

## 2018-06-28 MED ORDER — IBUPROFEN 600 MG PO TABS
600.0000 mg | ORAL_TABLET | Freq: Four times a day (QID) | ORAL | Status: DC
  Administered 2018-06-28 – 2018-06-29 (×3): 600 mg via ORAL
  Filled 2018-06-28 (×3): qty 1

## 2018-06-28 MED ORDER — LACTATED RINGERS IV SOLN
500.0000 mL | INTRAVENOUS | Status: DC | PRN
  Administered 2018-06-28: 1000 mL via INTRAVENOUS

## 2018-06-28 MED ORDER — OXYTOCIN BOLUS FROM INFUSION
500.0000 mL | Freq: Once | INTRAVENOUS | Status: AC
  Administered 2018-06-28: 500 mL via INTRAVENOUS

## 2018-06-28 MED ORDER — ACETAMINOPHEN 325 MG PO TABS
650.0000 mg | ORAL_TABLET | ORAL | Status: DC | PRN
  Administered 2018-06-28: 650 mg via ORAL
  Filled 2018-06-28: qty 2

## 2018-06-28 MED ORDER — FENTANYL-BUPIVACAINE-NACL 0.5-0.125-0.9 MG/250ML-% EP SOLN
EPIDURAL | Status: AC
  Filled 2018-06-28: qty 250

## 2018-06-28 MED ORDER — PRENATAL MULTIVITAMIN CH
1.0000 | ORAL_TABLET | Freq: Every day | ORAL | Status: DC
  Administered 2018-06-28 – 2018-06-29 (×2): 1 via ORAL
  Filled 2018-06-28 (×2): qty 1

## 2018-06-28 MED ORDER — SENNOSIDES-DOCUSATE SODIUM 8.6-50 MG PO TABS
2.0000 | ORAL_TABLET | ORAL | Status: DC
  Administered 2018-06-29: 01:00:00 2 via ORAL
  Filled 2018-06-28: qty 2

## 2018-06-28 MED ORDER — TETANUS-DIPHTH-ACELL PERTUSSIS 5-2.5-18.5 LF-MCG/0.5 IM SUSP: 0.5000 mL | Freq: Once | INTRAMUSCULAR | Status: DC

## 2018-06-28 MED ORDER — OXYTOCIN 40 UNITS IN NORMAL SALINE INFUSION - SIMPLE MED
2.5000 [IU]/h | INTRAVENOUS | Status: DC
  Administered 2018-06-28: 2.5 [IU]/h via INTRAVENOUS

## 2018-06-28 MED ORDER — TERBUTALINE SULFATE 1 MG/ML IJ SOLN: 0.2500 mg | Freq: Once | INTRAMUSCULAR | Status: DC | PRN

## 2018-06-28 MED ORDER — LACTATED RINGERS IV SOLN: INTRAVENOUS | Status: DC

## 2018-06-28 MED ORDER — ONDANSETRON HCL 4 MG/2ML IJ SOLN: 4.0000 mg | Freq: Four times a day (QID) | INTRAMUSCULAR | Status: DC | PRN

## 2018-06-28 MED ORDER — LIDOCAINE HCL (PF) 1 % IJ SOLN
INTRAMUSCULAR | Status: DC | PRN
  Administered 2018-06-28: 5 mL

## 2018-06-28 MED ORDER — OXYCODONE HCL 5 MG PO TABS: 5.0000 mg | ORAL_TABLET | ORAL | Status: DC | PRN

## 2018-06-28 MED ORDER — SODIUM CHLORIDE (PF) 0.9 % IJ SOLN
INTRAMUSCULAR | Status: DC | PRN
  Administered 2018-06-28: 12 mL/h via EPIDURAL

## 2018-06-28 NOTE — MAU Note (Signed)
Pt c/o of cntrx since 0200. Now 3 mins apart. Denies LOF or bleeding. +FM. Planning TOLAC

## 2018-06-28 NOTE — Anesthesia Postprocedure Evaluation (Signed)
Anesthesia Post Note  Patient: Denise Hunt  Procedure(s) Performed: AN AD HOC LABOR EPIDURAL     Patient location during evaluation: Mother Baby Anesthesia Type: Epidural Level of consciousness: awake Pain management: satisfactory to patient Vital Signs Assessment: post-procedure vital signs reviewed and stable Respiratory status: spontaneous breathing Cardiovascular status: stable Anesthetic complications: no    Last Vitals:  Vitals:   06/28/18 1120 06/28/18 1234  BP: 115/79 110/74  Pulse: 75 71  Resp: 16 16  Temp: 36.7 C 37.1 C  SpO2: 99%     Last Pain:  Vitals:   06/28/18 1235  TempSrc:   PainSc: 0-No pain   Pain Goal:                   [employer]

## 2018-06-28 NOTE — Anesthesia Procedure Notes (Signed)
Epidural Patient location during procedure: OB Start time: 06/28/2018 6:33 AM End time: 06/28/2018 6:43 AM  Staffing Anesthesiologist: Trevor Iha, MD Performed: anesthesiologist   Preanesthetic Checklist Completed: patient identified, site marked, surgical consent, pre-op evaluation, timeout performed, IV checked, risks and benefits discussed and monitors and equipment checked  Epidural Patient position: sitting Prep: site prepped and draped and DuraPrep Patient monitoring: continuous pulse ox and blood pressure Approach: midline Location: L2-L3 Injection technique: LOR air  Needle:  Needle type: Tuohy  Needle gauge: 17 G Needle length: 9 cm and 9 Needle insertion depth: 5 cm cm Catheter type: closed end flexible Catheter size: 19 Gauge Catheter at skin depth: 10 cm Test dose: negative  Assessment Events: blood not aspirated, injection not painful, no injection resistance, negative IV test and no paresthesia  Additional Notes Patient identified. Risks/Benefits/Options discussed with patient including but not limited to bleeding, infection, nerve damage, paralysis, failed block, incomplete pain control, headache, blood pressure changes, nausea, vomiting, reactions to medication both or allergic, itching and postpartum back pain. Confirmed with bedside nurse the patient's most recent platelet count. Confirmed with patient that they are not currently taking any anticoagulation, have any bleeding history or any family history of bleeding disorders. Patient expressed understanding and wished to proceed. All questions were answered. Sterile technique was used throughout the entire procedure. Please see nursing notes for vital signs. Test dose was given through epidural needle and negative prior to continuing to dose epidural or start infusion. Warning signs of high block given to the patient including shortness of breath, tingling/numbness in hands, complete motor block, or any  concerning symptoms with instructions to call for help. Patient was given instructions on fall risk and not to get out of bed. All questions and concerns addressed with instructions to call with any issues.1  Attempt (S) . Patient tolerated procedure well.

## 2018-06-28 NOTE — Anesthesia Preprocedure Evaluation (Signed)
Anesthesia Evaluation  Patient identified by MRN, date of birth, ID band Patient awake    Reviewed: Allergy & Precautions, NPO status , Patient's Chart, lab work & pertinent test results  Airway Mallampati: II  TM Distance: >3 FB Neck ROM: Full    Dental no notable dental hx.    Pulmonary neg pulmonary ROS,    Pulmonary exam normal breath sounds clear to auscultation       Cardiovascular hypertension, Normal cardiovascular exam Rhythm:Regular Rate:Normal     Neuro/Psych    GI/Hepatic negative GI ROS, Neg liver ROS,   Endo/Other  negative endocrine ROS  Renal/GU negative Renal ROS     Musculoskeletal   Abdominal   Peds  Hematology Hgb 12.9 Plt 191   Anesthesia Other Findings   Reproductive/Obstetrics (+) Pregnancy Pt w hx of placenta previa and a prior C/S                             Anesthesia Physical Anesthesia Plan  ASA: III  Anesthesia Plan: Epidural   Post-op Pain Management:    Induction:   PONV Risk Score and Plan:   Airway Management Planned:   Additional Equipment:   Intra-op Plan:   Post-operative Plan:   Informed Consent:   Plan Discussed with:   Anesthesia Plan Comments: (38 F for attempted VBac w hx of HTN )        Anesthesia Quick Evaluation

## 2018-06-28 NOTE — H&P (Signed)
Denise Hunt is a 38 y.o. [phone] at [redacted]w[redacted]d presenting for active labor with rupture of membranes and MAU. Pt notes onset contractions 2 AM. Good fetal movement, No vaginal bleeding, started leaking fluid at 6 AM.  [facility] at [facility] since 6 Wks -Dated by 6 weeks ultrasound, unsure LMP -Initial abnormal antibody screen suspect abnormal reaction to test, follow-up antibody screen at hospital was normal -VBAC attempt.  Successful vaginal delivery at term followed by 32-week emergency cesarean section for bleeding placenta previa.  Good candidate for [place]   Prenatal Transfer Tool  Maternal Diabetes: No Genetic Screening: Normal Maternal Ultrasounds/Referrals: Normal Fetal Ultrasounds or other Referrals:  None Maternal Substance Abuse:  No Significant Maternal Medications:  None Significant Maternal Lab Results: None     OB History    Gravida  4   Para  2   Term  1   Preterm  1   AB  1   Living  2     SAB  1   TAB  0   Ectopic  0   Multiple      Live Births  2          Past Medical History:  Diagnosis Date  . Pregnancy induced hypertension   . Urinary tract infection    Past Surgical History:  Procedure Laterality Date  . CESAREAN SECTION  10/25/2011   Procedure: CESAREAN SECTION;  Surgeon: Tresa Endo A. Ernestina Penna, MD;  Location: [facility];  Service: Obstetrics;  Laterality: N/A;  . DILATION AND CURETTAGE OF UTERUS     Family History: family history includes Colon cancer in her maternal grandmother; Diabetes in her father; Hypertension in her father and mother. Social History:  reports that she has never smoked. She has never used smokeless tobacco. She reports current alcohol use. She reports that she does not use drugs.  Review of Systems - Negative except Painful contractions and leaking fluid   Dilation: 8 Effacement (%): 80 Station: -2 Exam by:: TAYLOR SUMMERS,RN Blood pressure 138/87, pulse 79, temperature 97.6 F (36.4 C), temperature source  Tympanic, resp. rate 16, SpO2 100 %, unknown if currently breastfeeding.  Physical Exam:  Gen: well appearing, no distress, very uncomfortable with contractions  Abd: gravid, NT, no RUQ pain LE: Nontender edema, equal bilaterally, non-tender Toco: Every 2 minutes FH: baseline 140s, accelerations present, no deceleratons, 10 beat variability  Prenatal labs: ABO, Rh: --/--/PENDING (05/24 0534) Antibody: PENDING (05/24 0534) Rubella: Immune (10/31 0000) RPR: Nonreactive (10/31 0000)  HBsAg: Negative (10/31 0000)  HIV: Non-reactive (10/31 0000)  GBS: Negative (04/22 0000)  1 hr Glucola 106  Genetic screening normal quad Anatomy US normal   Assessment/Plan: 38 y.o. [id number] at [redacted]w[redacted]d Active labor.  GBS negative.  Tolac.  Expectant management.  Epidural.  Expect delivery soon.   Lendon Colonel 06/28/2018, 6:31 AM

## 2018-06-29 ENCOUNTER — Other Ambulatory Visit (HOSPITAL_COMMUNITY): Admission: RE | Admit: 2018-06-29 | Payer: BLUE CROSS/BLUE SHIELD | Source: Ambulatory Visit

## 2018-06-29 MED ORDER — TRAMADOL HCL 50 MG PO TABS: 50.0000 mg | ORAL_TABLET | Freq: Two times a day (BID) | ORAL | 0 refills | Status: AC | PRN

## 2018-06-29 MED ORDER — IBUPROFEN 800 MG PO TABS
800.0000 mg | ORAL_TABLET | Freq: Four times a day (QID) | ORAL | Status: DC
  Administered 2018-06-29 (×2): 800 mg via ORAL
  Filled 2018-06-29 (×2): qty 1

## 2018-06-29 MED ORDER — TRAMADOL HCL 50 MG PO TABS
50.0000 mg | ORAL_TABLET | Freq: Two times a day (BID) | ORAL | Status: DC | PRN
  Administered 2018-06-29: 50 mg via ORAL
  Filled 2018-06-29: qty 1

## 2018-06-29 NOTE — Lactation Note (Signed)
This note was copied from a baby's chart. Lactation Consultation Note Baby 15 hrs old. Mom stated baby is BF well. Mom BF her other children 3 months each. Mom has everted nipples, BF baby in cradle position. Mom denies need of assistance at this time. Encouraged STS, I&O and call for assistance or questions. Lactation brochure left at bedside.  Patient Name: Denise Hunt [id number]'[id number] Date: 06/29/2018 Reason for consult: Initial assessment   Maternal Data Has patient been taught Hand Expression?: Yes Does the patient have breastfeeding experience prior to this delivery?: Yes  Feeding Feeding Type: Breast Fed  LATCH Score Latch: Repeated attempts needed to sustain latch, nipple held in mouth throughout feeding, stimulation needed to elicit sucking reflex.  Audible Swallowing: A few with stimulation  Type of Nipple: Everted at rest and after stimulation  Comfort (Breast/Nipple): Soft / non-tender  Hold (Positioning): No assistance needed to correctly position infant at breast.  LATCH Score: 8  Interventions Interventions: Breast feeding basics reviewed  Lactation Tools Discussed/Used WIC Program: No   Consult Status Consult Status: Follow-up Date: 06/30/18 Follow-up type: In-patient    Tabari Volkert, Diamond Nickel 06/29/2018, 12:00 AM

## 2018-06-29 NOTE — Progress Notes (Signed)
PPD 1 SVD with 1st degree repair / VBAC  Subjective:   reports feeling sore and lots of cramps - Ibuprofen not working / Microbiologist causes itching (hives previous CS) tolerating po intake without nausea or vomiting vaginal bleeding reported as moderate up ad lib / ambulatory in room / voiding QS without urinary concerns  female newborn breastfeeding without difficulty  Objective:   VS: BP 117/71   Pulse 72   Temp 98.3 F (36.8 C)   Resp 18   SpO2 95%   Breastfeeding Unknown   LABS:  Recent Labs    06/28/18 0545  WBC 12.8*  HGB 12.9  PLT 191   Blood type: A positive Rubella: Immune (10/31 0000)        Physical Exam: alert and oriented X3 without any distress or pai abdomen soft, non-tender, non-distended  uterine fundus firm, non-tender, U-1 perineum mild edema / lochia moderate extremities: no edema, no calf pain or tenderness   Assessment / Plan:  PPD # 1 SVD with 1st degree repair  adjust analgesia - increase Motrin and added prn Tramadol for pain/cramps  anticipate DC tomorrow  Marlinda Mike CNM, MSN, [facility] 06/29/2018, 9:04 AM

## 2018-07-01 ENCOUNTER — Inpatient Hospital Stay (HOSPITAL_COMMUNITY): Admission: AD | Admit: 2018-07-01 | Payer: BLUE CROSS/BLUE SHIELD | Source: Home / Self Care | Admitting: Obstetrics

## 2018-07-01 ENCOUNTER — Inpatient Hospital Stay (HOSPITAL_COMMUNITY): Payer: BLUE CROSS/BLUE SHIELD

## 2018-07-01 ENCOUNTER — Encounter (HOSPITAL_COMMUNITY): Payer: BLUE CROSS/BLUE SHIELD

## 2018-07-01 LAB — NOVEL CORONAVIRUS, NAA (HOSP ORDER, SEND-OUT TO REF LAB; TAT 18-24 HRS): SARS-CoV-2, NAA: NOT DETECTED

## 2018-07-02 LAB — BPAM RBC
Blood Product Expiration Date: 202006102359
Blood Product Expiration Date: 202006102359
Unit Type and Rh: 6200
Unit Type and Rh: 6200

## 2018-07-02 LAB — TYPE AND SCREEN
ABO/RH(D): A POS
Antibody Screen: NEGATIVE
Unit division: 0
Unit division: 0

## 2018-07-07 NOTE — Discharge Summary (Signed)
OB Discharge Summary  Patient Name: Denise ClockJennifer E Hunt DOB: 22-Jan-1981 MRN: [id number]  Date of admission: 06/28/2018 Intrauterine pregnancy: 221w6d   Admitting diagnosis: 39wks, contractions Secondary diagnosis: previous cesarean section  Date of discharge: 07/07/2018    Discharge diagnosis: Term Pregnancy Delivered and VBAC     Prenatal history: [id number]   EDC : 06/29/2018, by Other Basis  [facility]  Prenatal course complicated by previous CS  Prenatal Labs: ABO, Rh: --/--/A POS (05/24 0545)  Antibody: NEG (05/24 0545) Rubella: Immune (10/31 0000)   RPR: Non Reactive (05/24 0545)  HBsAg: Negative (10/31 0000)  HIV: Non-reactive (10/31 0000)  GBS: Negative (04/22 0000)                                    Hospital course:  Onset of Labor With Vaginal Delivery     38 y.o. yo [phone] at 501w6d was admitted in Active Labor on 06/28/2018 with desire for VBAC - allowed TOLAC. Patient had an uncomplicated labor course as follows:   Membrane Rupture Time/Date: 5:15 AM ,06/28/2018   Intrapartum Procedures: Episiotomy: None [1]                                         Lacerations:  1st degree [2]  Patient had a delivery of a Viable infant. 06/28/2018  Information for the patient's newborn:  Modesta MessingHerring, Evelyn Gray [[id number]][[id number]]  Delivery Method: VBAC, Spontaneous(Filed from Delivery Summary)   Pateint had an uncomplicated postpartum course.  She is ambulating, tolerating a regular diet, passing flatus, and urinating well. Patient is discharged home in stable condition on 07/07/18.  Delivering PROVIDER: Noland FordyceFOGLEMAN, KELLY                                                            Complications: None  Newborn Data: Live born female  Birth Weight: 7 lb 13.6 oz (3560 g) APGAR: 8, 9  Newborn Delivery   Birth date/time:  06/28/2018 08:41:00 Delivery type:  VBAC, Spontaneous     Baby Feeding: Breast Disposition:home with mother  Labs: Lab Results   Component Value Date   WBC 12.8 (H) 06/28/2018   HGB 12.9 06/28/2018   HCT 37.5 06/28/2018   MCV 95.4 06/28/2018   PLT 191 06/28/2018   CMP Latest Ref Rng & Units 10/01/2011  Glucose mg/dL 99  BUN 6 - 23 mg/dL -  Creatinine 0.4 - 1.2 mg/dL -  Sodium 063135 - 016145 mEq/L -  Potassium 3.5 - 5.1 mEq/L -  Chloride 96 - 112 mEq/L -  CO2 19 - 32 mEq/L -  Calcium 8.4 - 10.5 mg/dL -  Total Protein 6.0 - 8.3 g/dL -  Total Bilirubin 0.3 - 1.2 mg/dL -  Alkaline Phos 39 - 010117 U/L -  AST 0 - 37 U/L -  ALT 0 - 35 U/L -    Physical Exam @ time of discharge:  Vitals:   06/28/18 2051 06/29/18 0045 06/29/18 0530 06/29/18 1353  BP: 112/72 111/73 117/71 124/77  Pulse: 67 64 72 83  Resp: 16 18 18  17  Temp:  98.4 F (36.9 C) 98.3 F (36.8 C) 98.8 F (37.1 C)  TempSrc:  Oral  Tympanic  SpO2: 95% 95% 95% 99%   general: alert, cooperative and no distress lochia: appropriate uterine fundus: firm perineum: no edema / repaired extremities: DVT Evaluation: No evidence of DVT seen on physical exam.  Discharge instructions:  "Baby and Me Booklet" and [facility] Booklet Discharge Medications:  Allergies as of 06/29/2018      Reactions   Labetalol Other (See Comments)   Took with first pregnancy-caused her heart to race and a panic attack.      Medication List    STOP taking these medications   HYDROmorphone 2 MG tablet Commonly known as:  DILAUDID     TAKE these medications   ibuprofen 800 MG tablet Commonly known as:  ADVIL Take 1 tablet (800 mg total) by mouth every 8 (eight) hours.   prenatal multivitamin Tabs tablet Take 1 tablet by mouth at bedtime.   traMADol 50 MG tablet Commonly known as:  ULTRAM Take 1 tablet (50 mg total) by mouth every 12 (twelve) hours as needed for moderate pain. What changed:    how much to take  when to take this  reasons to take this      Diet: routine diet Activity: Advance as tolerated. Pelvic rest x 6 weeks.  Follow up:6  weeks  Signed: Marlinda Mike CNM, MSN, [facility] 07/07/2018, 10:16 AM
